# Patient Record
Sex: Female | Born: 1980 | Race: White | Hispanic: No | Smoking: Never smoker
Health system: Southern US, Community
[De-identification: ages and names within clinical notes are randomized; demographics above are authoritative.]

## PROBLEM LIST (undated history)

## (undated) DIAGNOSIS — G43909 Migraine, unspecified, not intractable, without status migrainosus: Secondary | ICD-10-CM

## (undated) DIAGNOSIS — N809 Endometriosis, unspecified: Secondary | ICD-10-CM

## (undated) DIAGNOSIS — I10 Essential (primary) hypertension: Secondary | ICD-10-CM

## (undated) HISTORY — DX: Essential (primary) hypertension: I10

## (undated) HISTORY — DX: Endometriosis, unspecified: N80.9

## (undated) HISTORY — DX: Migraine, unspecified, not intractable, without status migrainosus: G43.909

---
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
## 1999-03-09 ENCOUNTER — Other Ambulatory Visit: Admission: RE | Admit: 1999-03-09 | Discharge: 1999-03-09 | Payer: Self-pay | Admitting: Gynecology

## 2000-01-30 ENCOUNTER — Other Ambulatory Visit: Admission: RE | Admit: 2000-01-30 | Discharge: 2000-01-30 | Payer: Self-pay | Admitting: Internal Medicine

## 2000-05-30 ENCOUNTER — Other Ambulatory Visit: Admission: RE | Admit: 2000-05-30 | Discharge: 2000-05-30 | Payer: Self-pay | Admitting: Gynecology

## 2000-07-10 ENCOUNTER — Other Ambulatory Visit: Admission: RE | Admit: 2000-07-10 | Discharge: 2000-07-10 | Payer: Self-pay | Admitting: Obstetrics and Gynecology

## 2000-07-10 ENCOUNTER — Encounter (INDEPENDENT_AMBULATORY_CARE_PROVIDER_SITE_OTHER): Payer: Self-pay

## 2000-10-12 ENCOUNTER — Other Ambulatory Visit: Admission: RE | Admit: 2000-10-12 | Discharge: 2000-10-12 | Payer: Self-pay | Admitting: Obstetrics and Gynecology

## 2001-02-25 ENCOUNTER — Other Ambulatory Visit: Admission: RE | Admit: 2001-02-25 | Discharge: 2001-02-25 | Payer: Self-pay | Admitting: Obstetrics and Gynecology

## 2002-02-25 ENCOUNTER — Other Ambulatory Visit: Admission: RE | Admit: 2002-02-25 | Discharge: 2002-02-25 | Payer: Self-pay | Admitting: Obstetrics and Gynecology

## 2004-08-30 ENCOUNTER — Other Ambulatory Visit: Admission: RE | Admit: 2004-08-30 | Discharge: 2004-08-30 | Payer: Self-pay | Admitting: Obstetrics & Gynecology

## 2004-08-31 ENCOUNTER — Other Ambulatory Visit: Admission: RE | Admit: 2004-08-31 | Discharge: 2004-08-31 | Payer: Self-pay | Admitting: Obstetrics & Gynecology

## 2005-09-04 ENCOUNTER — Other Ambulatory Visit: Admission: RE | Admit: 2005-09-04 | Discharge: 2005-09-04 | Payer: Self-pay | Admitting: Obstetrics & Gynecology

## 2008-04-30 ENCOUNTER — Inpatient Hospital Stay (HOSPITAL_COMMUNITY): Admission: AD | Admit: 2008-04-30 | Discharge: 2008-05-03 | Payer: Self-pay | Admitting: Obstetrics and Gynecology

## 2011-05-15 LAB — CBC
Hemoglobin: 10.1 — ABNORMAL LOW
MCHC: 34.1
MCHC: 34.6
MCV: 84
MCV: 84.5
Platelets: 271
Platelets: 305
RBC: 3.47 — ABNORMAL LOW
RBC: 4.07
RBC: 4.43
RDW: 12.6
WBC: 13.4 — ABNORMAL HIGH
WBC: 13.4 — ABNORMAL HIGH

## 2011-05-15 LAB — URINALYSIS, ROUTINE W REFLEX MICROSCOPIC
Glucose, UA: NEGATIVE
Leukocytes, UA: NEGATIVE
Protein, ur: NEGATIVE
pH: 6

## 2011-05-15 LAB — COMPREHENSIVE METABOLIC PANEL
ALT: 15
ALT: 16
AST: 14
AST: 25
Albumin: 2.4 — ABNORMAL LOW
Albumin: 2.7 — ABNORMAL LOW
CO2: 22
CO2: 24
Calcium: 9
Calcium: 9.4
Chloride: 105
Creatinine, Ser: 0.6
GFR calc Af Amer: >60
GFR calc Af Amer: >60
GFR calc non Af Amer: >60
GFR calc non Af Amer: >60
Sodium: 134 — ABNORMAL LOW
Sodium: 137
Total Bilirubin: 0.2 — ABNORMAL LOW
Total Protein: 5.4 — ABNORMAL LOW

## 2011-05-15 LAB — URINE MICROSCOPIC-ADD ON

## 2011-05-15 LAB — RPR: RPR Ser Ql: NONREACTIVE

## 2015-09-23 DIAGNOSIS — I10 Essential (primary) hypertension: Secondary | ICD-10-CM | POA: Insufficient documentation

## 2016-05-25 DIAGNOSIS — E782 Mixed hyperlipidemia: Secondary | ICD-10-CM | POA: Insufficient documentation

## 2019-08-15 HISTORY — PX: ANKLE SURGERY: SHX546

## 2020-04-08 ENCOUNTER — Emergency Department (INDEPENDENT_AMBULATORY_CARE_PROVIDER_SITE_OTHER)
Admission: RE | Admit: 2020-04-08 | Discharge: 2020-04-08 | Disposition: A | Discharge status: Home / Self Care | Payer: 59 | Source: Ambulatory Visit

## 2020-04-08 ENCOUNTER — Other Ambulatory Visit: Payer: Self-pay

## 2020-04-08 ENCOUNTER — Emergency Department (INDEPENDENT_AMBULATORY_CARE_PROVIDER_SITE_OTHER): Payer: 59

## 2020-04-08 VITALS — BP 151/90 | HR 73 | Temp 98.8°F | Resp 18

## 2020-04-08 DIAGNOSIS — S93401A Sprain of unspecified ligament of right ankle, initial encounter: Secondary | ICD-10-CM

## 2020-04-08 DIAGNOSIS — M25572 Pain in left ankle and joints of left foot: Secondary | ICD-10-CM

## 2020-04-08 DIAGNOSIS — M25471 Effusion, right ankle: Secondary | ICD-10-CM

## 2020-04-08 DIAGNOSIS — M25472 Effusion, left ankle: Secondary | ICD-10-CM

## 2020-04-08 DIAGNOSIS — M25571 Pain in right ankle and joints of right foot: Secondary | ICD-10-CM | POA: Diagnosis not present

## 2020-04-08 DIAGNOSIS — W1839XA Other fall on same level, initial encounter: Secondary | ICD-10-CM | POA: Diagnosis not present

## 2020-04-08 DIAGNOSIS — M25562 Pain in left knee: Secondary | ICD-10-CM

## 2020-04-08 DIAGNOSIS — W010XXA Fall on same level from slipping, tripping and stumbling without subsequent striking against object, initial encounter: Secondary | ICD-10-CM

## 2020-04-08 DIAGNOSIS — M958 Other specified acquired deformities of musculoskeletal system: Secondary | ICD-10-CM

## 2020-04-08 NOTE — ED Triage Notes (Signed)
Pt c/o bilateral ankle pain, more so the RT and LT knee pain after falling carrying boxes while moving. Pain 5/10 Ibuprofen prn.

## 2020-04-08 NOTE — Discharge Instructions (Signed)
  You may take 500mg  acetaminophen every 4-6 hours or in combination with ibuprofen 400-600mg  every 6-8 hours as needed for pain and inflammation.  Call to schedule follow up with Sports Medicine in 1-2 weeks for recheck of symptoms and further evaluation of chronic right ankle pain and weakness.

## 2020-04-08 NOTE — ED Provider Notes (Signed)
[facility]    CSN: [id number] Arrival date & time: 04/08/20  1251      History   Chief Complaint Chief Complaint  Patient presents with  . Appointment    1pm  . Ankle Pain    Bilateral    HPI Courtney Kennedy is a 39 y.o. female.   HPI  Courtney Kennedy is a 39 y.o. female presenting to UC with c/o bilateral ankle pain and swelling, worse in Right ankle, and Left knee pain and swelling to anterior aspect after rolling her Right ankle and falling yesterday while carrying a box.  Pain is aching, 5/10. No hx of fracture in ankles in the past but reports hx of chronic ankle weakness in Right ankle. Denies hitting her head during the fall.    History reviewed. No pertinent past medical history.  There are no problems to display for this patient.   History reviewed. No pertinent surgical history.  OB History   No obstetric history on file.      Home Medications    Prior to Admission medications   Medication Sig Start Date End Date Taking? Authorizing Provider  cetirizine (ZYRTEC) 10 MG tablet Take 10 mg by mouth daily.   Yes [provider]  fexofenadine-pseudoephedrine (ALLEGRA-D 24) 180-240 MG 24 hr tablet Take 1 tablet by mouth daily. 11/03/19 11/02/20 Yes [provider]  lisinopril (ZESTRIL) 10 MG tablet Take by mouth. 11/24/19  Yes [provider]  rizatriptan (MAXALT) 10 MG tablet Take by mouth. 08/22/19  Yes [provider]    Family History History reviewed. No pertinent family history.  Social History Social History   Tobacco Use  . Smoking status: Never Smoker  . Smokeless tobacco: Never Used  Vaping Use  . Vaping Use: Never used  Substance Use Topics  . Alcohol use: Not Currently  . Drug use: Not on file     Allergies   Patient has no known allergies.   Review of Systems Review of Systems  Musculoskeletal: Positive for arthralgias and joint swelling.  Skin: Negative for color change and wound.      Physical Exam Triage Vital Signs ED Triage Vitals  Enc Vitals Group     BP 04/08/20 1318 (!) 151/90     Pulse Rate 04/08/20 1318 73     Resp 04/08/20 1318 18     Temp 04/08/20 1318 98.8 F (37.1 C)     Temp Source 04/08/20 1318 Oral     SpO2 04/08/20 1318 99 %     Weight --      Height --      Head Circumference --      Peak Flow --      Pain Score 04/08/20 1319 5     Pain Loc --      Pain Edu? --      Excl. in GC? --    No data found.  Updated Vital Signs BP (!) 151/90 (BP Location: Right Arm)   Pulse 73   Temp 98.8 F (37.1 C) (Oral)   Resp 18   SpO2 99%   Visual Acuity Right Eye Distance:   Left Eye Distance:   Bilateral Distance:    Right Eye Near:   Left Eye Near:    Bilateral Near:     Physical Exam Vitals and nursing note reviewed.  Constitutional:      Appearance: Normal appearance. She is well-developed.  HENT:     Head: Normocephalic and atraumatic.  Cardiovascular:     Rate and Rhythm: Normal rate.  Pulmonary:     Effort: Pulmonary effort is normal.  Musculoskeletal:        General: Swelling and tenderness present. Normal range of motion.     Cervical back: Normal range of motion.     Comments: Right ankle: moderate edema lateral aspect, tender. Full ROM, increased pain with dorsi-and plantar- flexion Left ankle: mild edema to lateral aspect with tenderness. Full ROM Left knee: mild edema, tenderness over patella. Full ROM Calves are soft, non-tender   Skin:    General: Skin is warm and dry.     Findings: No bruising or erythema.  Neurological:     Mental Status: She is alert and oriented to person, place, and time.  Psychiatric:        Behavior: Behavior normal.      UC Treatments / Results  Labs (all labs ordered are listed, but only abnormal results are displayed) Labs Reviewed - No data to display  EKG   Radiology DG Ankle Complete Left  Result Date: 04/08/2020 CLINICAL DATA:  Left ankle pain after fall yesterday  EXAM: LEFT ANKLE COMPLETE - 3+ VIEW COMPARISON:  None. FINDINGS: There is no evidence of fracture, dislocation, or joint effusion. There is no evidence of arthropathy or other focal bone abnormality. Soft tissues are unremarkable. IMPRESSION: Negative. Electronically Signed   By: Lupita Raider M.D.   On: 04/08/2020 14:16   DG Ankle Complete Right  Result Date: 04/08/2020 CLINICAL DATA:  Lateral right ankle pain after fall EXAM: RIGHT ANKLE - COMPLETE 3+ VIEW COMPARISON:  None. FINDINGS: There is no evidence of fracture, dislocation, or joint effusion. There is a 4 mm rounded lucency at the medial talar shoulder. Joint spaces are maintained without evidence of significant arthropathy. Diffuse soft tissue swelling about the right ankle. IMPRESSION: 1. Diffuse soft tissue swelling about the right ankle without acute fracture or dislocation. 2. 4 mm rounded lucency at the medial talar shoulder suggestive of an osteochondral defect. MRI could be performed for further characterization on a nonemergent outpatient basis. Electronically Signed   By: Duanne Guess D.O.   On: 04/08/2020 14:14   DG Knee Complete 4 Views Left  Result Date: 04/08/2020 CLINICAL DATA:  Left knee pain after fall yesterday. EXAM: LEFT KNEE - COMPLETE 4+ VIEW COMPARISON:  None. FINDINGS: No evidence of fracture, dislocation, or joint effusion. No evidence of arthropathy or other focal bone abnormality. Soft tissues are unremarkable. IMPRESSION: Negative. Electronically Signed   By: Lupita Raider M.D.   On: 04/08/2020 14:15    Procedures Procedures (including critical care time)  Medications Ordered in UC Medications - No data to display  Initial Impression / Assessment and Plan / UC Course  I have reviewed the triage vital signs and the nursing notes.  Pertinent labs & imaging results that were available during my care of the patient were reviewed by me and considered in my medical decision making (see chart for  details).     Reviewed imaging with pt Pt placed in stirrup splint on Right ankle for comfort and support F/u with Sports Medicine  AVS given  Final Clinical Impressions(s) / UC Diagnoses   Final diagnoses:  Pain and swelling of right ankle  Pain and swelling of left ankle  Pain and swelling of left knee  Osteochondral defect of ankle  Sprain of right ankle, unspecified ligament, initial encounter  Fall from slip, trip, or stumble, initial encounter  Discharge Instructions      You may take 500mg  acetaminophen every 4-6 hours or in combination with ibuprofen 400-600mg  every 6-8 hours as needed for pain and inflammation.  Call to schedule follow up with Sports Medicine in 1-2 weeks for recheck of symptoms and further evaluation of chronic right ankle pain and weakness.     ED Prescriptions    None     PDMP not reviewed this encounter.   , Lurene Shadow 04/08/20 1524

## 2020-04-26 ENCOUNTER — Ambulatory Visit (INDEPENDENT_AMBULATORY_CARE_PROVIDER_SITE_OTHER): Payer: 59 | Admitting: Sports Medicine

## 2020-04-26 DIAGNOSIS — M958 Other specified acquired deformities of musculoskeletal system: Secondary | ICD-10-CM | POA: Diagnosis not present

## 2020-04-26 MED ORDER — MELOXICAM 15 MG PO TABS: ORAL_TABLET | ORAL | 3 refills | Status: DC

## 2020-04-26 NOTE — Progress Notes (Signed)
    Procedures performed today:    None.  Independent interpretation of notes and tests performed by another provider:   None.  Brief History, Exam, Impression, and Recommendations:    Courtney Kennedy is a pleasant 39yo female who presents today for follow up on right ankle pain. A couple weeks ago she fell while moving boxes. She was seen in UC a couple of weeks ago who did Xrays that demonstrated an osteochondral defect of the right medial talus. She has point tenderness over this location. She also has pain on the posterior aspect of the lateral malleolus. We are going to immobilize her ankle in a CAM boot for 4 weeks and give her some meloxicam. We will have her back to reevaluate in 4 weeks. If the pain is no better we will get an MRI.   Aurelio Jew, MS3   ___________________________________________ Ihor Austin. Benjamin Stain, M.D., ABFM., CAQSM. Primary Care and Sports Medicine [facility]  Adjunct Instructor of Family Medicine  [employer]

## 2020-04-26 NOTE — Patient Instructions (Signed)
Osteochondral Fracture  An osteochondral fracture is a break or tear (rupture) in the protective tissue that cushions bones in the joints (articular cartilage). Osteochondral fractures most commonly happen in the knees or ankles, but they can also happen in other parts of the body. What are the causes? This condition is caused by a forceful hit to your joint. It can be caused by one event or from repetitive damage to your joint over time. What increases the risk? You are more likely to develop this condition if you are:  A younger person. Children and adolescents are at greater risk of this condition.  An athlete. What are the signs or symptoms? Symptoms of this condition include:  Swelling.  Pain.  A crackling sound (crepitation) when you move the joint.  Difficulty moving the joint, or feeling like the joint catches or locks when you try to move it.  Instability of the joint. How is this diagnosed? This condition is diagnosed based on:  Your complete medical history.  A physical exam.  Imaging tests. These may include X-rays, a CT scan, or an MRI. How is this treated? This condition may be treated by:  Icing the injured area and taking medicines. These help with pain and inflammation.  Using crutches. These help with walking. They may be needed if the injured joint is in one of the legs.  Wearing a cast or splint. This keeps the cartilage and bones in place so they can heal.  Having surgery. This may be done if other treatments do not work. During the surgery, any bones that have broken off will either be put into place or be removed if they cannot be reattached.  Doing joint and muscle exercises. These help to strengthen and stretch the affected joints and muscles. They may be done at home or with a physical therapist. If treated properly, symptoms go away in most cases. Follow these instructions at home: Medicines  Take over-the-counter and prescription medicines as  told by your health care provider.  Ask your health care provider if the medicine prescribed to you: ? Requires you to avoid driving or using heavy machinery. ? Can cause constipation. You may need to take actions to prevent or treat constipation, such as:  Drink enough fluid to keep your urine pale yellow.  Take over-the-counter or prescription medicines.  Eat foods that are high in fiber, such as beans, whole grains, and fresh fruits and vegetables.  Limit foods that are high in fat and processed sugars, such as fried or sweet foods. If you have a cast:  Do not stick anything inside the cast to scratch your skin. Doing that increases your risk of infection.  You may put lotion on dry skin around the edges of the cast. Do not put lotion on the skin underneath the cast.  Check the skin around the cast every day. Tell your health care provider about any concerns.  Keep the cast clean and dry. If you have a splint:  Wear the splint as told by your health care provider. Remove it only as told by your health care provider.  Loosen the splint if your fingers or toes tingle, become numb, or turn cold and blue.  Keep the splint clean and dry. Bathing  Do not take baths, swim, or use a hot tub until your health care provider approves. Ask your health care provider if you may take showers. You may only be allowed to take sponge baths.  If your cast or splint  is not waterproof: ? Do not let it get wet. ? Cover it with a watertight covering when you take a bath or shower. Managing pain, stiffness, and swelling   If directed, put ice on the injured area. ? If you have a removable splint, remove it as told by your health care provider. ? Put ice in a plastic bag. ? Place a towel between your skin and the bag or between your cast and the bag. ? Leave the ice on for 20 minutes, 2-3 times a day.  Move your fingers or toes often to reduce stiffness and swelling.  Raise (elevate) the  injured area above the level of your heart while you are sitting or lying down. Activity  Do not lift anything that is heavier than 10 lb (4.5 kg), or the limit that you are told, until your health care provider says that it is safe.  Perform exercises daily as told by your health care provider or physical therapist.  Return to your normal activities as told by your health care provider. Ask your health care provider what activities are safe for you. General instructions  Do not put pressure on any part of the cast or splint until it is fully hardened. This may take several hours.  Ask your health care provider when it is safe to drive if you have a cast or splint on your arm or leg.  Do not use any products that contain nicotine or tobacco, such as cigarettes, e-cigarettes, and chewing tobacco. These can delay healing. If you need help quitting, ask your health care provider.  Keep all follow-up visits as told by your health care provider. This is important. Contact a health care provider if:  You have symptoms that get worse or do not improve after 2 weeks of treatment. Get help right away if:  You have severe pain. Summary  An osteochondral fracture is a break or tear in the protective tissue that cushions bones in the joints.  This condition is caused by a forceful hit to your joint. It can be caused by one event or from repetitive damage to your joint over time.  These fractures most commonly happen in the knees or ankles, but they can also happen in other parts of the body.  This injury is treated with rest, ice, medicines, and surgery if needed.  Contact a health care provider if your symptoms get worse or do not improve after 2 weeks of treatment. This information is not intended to replace advice given to you by your health care provider. Make sure you discuss any questions you have with your health care provider. Document Revised: 11/21/2018 Document Reviewed:  07/08/2018 Elsevier Patient Education  2020 [employer].

## 2020-04-26 NOTE — Assessment & Plan Note (Signed)
This is a very pleasant 39 year old female, she is currently moving, unfortunately she had a fall, injured her knee and her ankles, her left ankle and knee are getting better, unfortunately she still has significant pain both at the medial talar dome as well as behind the lateral malleolus. She was seen in urgent care where x-rays showed what appeared to be an osteochondral defect of the medial talar dome. The fall was 2 weeks ago so I do think she needs prolonged immobilization for at least another month with minimal weightbearing. We will place her in a cam boot, add meloxicam for pain, return to see me in a month, MRI if no better.

## 2020-05-01 MED ORDER — TRAMADOL HCL 50 MG PO TABS: 50.0000 mg | ORAL_TABLET | Freq: Three times a day (TID) | ORAL | 0 refills | Status: DC | PRN

## 2020-05-24 ENCOUNTER — Ambulatory Visit (INDEPENDENT_AMBULATORY_CARE_PROVIDER_SITE_OTHER): Payer: 59 | Admitting: Sports Medicine

## 2020-05-24 ENCOUNTER — Other Ambulatory Visit: Payer: Self-pay

## 2020-05-24 ENCOUNTER — Ambulatory Visit (INDEPENDENT_AMBULATORY_CARE_PROVIDER_SITE_OTHER): Payer: 59

## 2020-05-24 DIAGNOSIS — S93491A Sprain of other ligament of right ankle, initial encounter: Secondary | ICD-10-CM | POA: Diagnosis not present

## 2020-05-24 DIAGNOSIS — S93412A Sprain of calcaneofibular ligament of left ankle, initial encounter: Secondary | ICD-10-CM

## 2020-05-24 DIAGNOSIS — M958 Other specified acquired deformities of musculoskeletal system: Secondary | ICD-10-CM | POA: Diagnosis not present

## 2020-05-24 NOTE — Assessment & Plan Note (Signed)
This is a very pleasant 39 year old female, she was moving, had a fall, injured her knee and her ankle. Unfortunately 1 month is passed and she continues to have severe pain in her ankle in spite of boot immobilization. X-rays at the time did show what appeared to be a large medial talar dome OCD. Due to failure of conservative treatment we are going to proceed with MRI as we are planning a surgical procedure such as microfracture or cartilage plug. I am also going to going to set her up with a referral to Dr. Susa Simmonds.

## 2020-05-24 NOTE — Progress Notes (Signed)
    Procedures performed today:    None.  Independent interpretation of notes and tests performed by another provider:   None.  Brief History, Exam, Impression, and Recommendations:    Osteochondral defect of right medial talus This is a very pleasant 39 year old female, she was moving, had a fall, injured her knee and her ankle. Unfortunately 1 month is passed and she continues to have severe pain in her ankle in spite of boot immobilization. X-rays at the time did show what appeared to be a large medial talar dome OCD. Due to failure of conservative treatment we are going to proceed with MRI as we are planning a surgical procedure such as microfracture or cartilage plug. I am also going to going to set her up with a referral to Dr. Susa Simmonds.    ___________________________________________ Ihor Austin. Benjamin Stain, M.D., ABFM., CAQSM. Primary Care and Sports Medicine [place] [facility]  Adjunct Instructor of Family Medicine  [employer]

## 2020-06-11 ENCOUNTER — Other Ambulatory Visit: Payer: Self-pay

## 2020-06-11 ENCOUNTER — Ambulatory Visit (INDEPENDENT_AMBULATORY_CARE_PROVIDER_SITE_OTHER): Payer: 59 | Admitting: Sports Medicine

## 2020-06-11 ENCOUNTER — Ambulatory Visit (INDEPENDENT_AMBULATORY_CARE_PROVIDER_SITE_OTHER): Payer: 59

## 2020-06-11 DIAGNOSIS — M958 Other specified acquired deformities of musculoskeletal system: Secondary | ICD-10-CM

## 2020-06-11 DIAGNOSIS — M2242 Chondromalacia patellae, left knee: Secondary | ICD-10-CM | POA: Diagnosis not present

## 2020-06-11 NOTE — Progress Notes (Signed)
° ° °  Procedures performed today:    Procedure: Real-time Ultrasound Guided injection of the left knee Device: Samsung HS60  Verbal informed consent obtained.  Time-out conducted.  Noted no overlying erythema, induration, or other signs of local infection.  Skin prepped in a sterile fashion.  Local anesthesia: Topical Ethyl chloride.  With sterile technique and under real time ultrasound guidance:  Noted no effusion, 1 cc Kenalog 40, 2 cc lidocaine, 2 cc bupivacaine injected easily Completed without difficulty  Pain immediately resolved suggesting accurate placement of the medication.  Advised to call if fevers/chills, erythema, induration, drainage, or persistent bleeding.  Images permanently stored and available for review in PACS.  Impression: Technically successful ultrasound guided injection.  Independent interpretation of notes and tests performed by another provider:   None.  Brief History, Exam, Impression, and Recommendations:    Chondromalacia of patellofemoral joint, left This is a pleasant 39 year old female, she is coming in with left-sided knee pain, localized mostly anteriorly, and at rest. She is tender around the patellar facets, exam is otherwise unremarkable. She has been taking some over-the-counter analgesics without much improvement. Today we injected her knee, adding some formal physical therapy. Return to see me in 4 weeks, MRI if no better.  Osteochondral defect of right medial talus Dr. Susa Simmonds has consulted on the large medial talar dome OCD, the current plan is immobilization and nonweightbearing.     ___________________________________________ Ihor Austin. Benjamin Stain, M.D., ABFM., CAQSM. Primary Care and Sports Medicine [place] [facility]  Adjunct Instructor of Family Medicine  [employer]

## 2020-06-11 NOTE — Assessment & Plan Note (Signed)
Dr. Susa Simmonds has consulted on the large medial talar dome OCD, the current plan is immobilization and nonweightbearing.

## 2020-06-11 NOTE — Assessment & Plan Note (Signed)
This is a pleasant 39 year old female, she is coming in with left-sided knee pain, localized mostly anteriorly, and at rest. She is tender around the patellar facets, exam is otherwise unremarkable. She has been taking some over-the-counter analgesics without much improvement. Today we injected her knee, adding some formal physical therapy. Return to see me in 4 weeks, MRI if no better.

## 2020-06-16 ENCOUNTER — Encounter: Payer: Self-pay | Admitting: Physical Therapy

## 2020-06-16 ENCOUNTER — Ambulatory Visit (INDEPENDENT_AMBULATORY_CARE_PROVIDER_SITE_OTHER): Payer: 59 | Admitting: Physical Therapy

## 2020-06-16 ENCOUNTER — Other Ambulatory Visit: Payer: Self-pay

## 2020-06-16 DIAGNOSIS — M6281 Muscle weakness (generalized): Secondary | ICD-10-CM | POA: Diagnosis not present

## 2020-06-16 DIAGNOSIS — M25562 Pain in left knee: Secondary | ICD-10-CM

## 2020-06-16 NOTE — Therapy (Addendum)
[place]  [address] [place], [place], [address] Phone: [phone]   Fax:  [phone]  Physical Therapy Evaluation and Discharge Summary  Patient Details  Name: Courtney Kennedy MRN: [id number] Date of Birth: 1981-03-03 Referring Provider (PT): Aundria Mems   Encounter Date: 06/16/2020   PT End of Session - 06/16/20 0849    Visit Number 1    Date for PT Re-Evaluation 07/28/20    Authorization Type [employer]    PT Start Time 0849    PT Stop Time 0930    PT Time Calculation (min) 41 min    Activity Tolerance Patient tolerated treatment well    Behavior During Therapy [facility] for tasks assessed/performed           History reviewed. No pertinent past medical history.  History reviewed. No pertinent surgical history.  There were no vitals filed for this visit.    Subjective Assessment - 06/16/20 0852    Subjective Patient fell in August and sprained both ankles and fell on right knee. The right ankle had an old injury which she thinks caused the fall. She is now in a CAM boot until the end of the month. Xrays neg. About 1.5 weeks ago right knee was swollen, but not much pain. She had just started using her scooter 3 days prior. Last week she had injection. Normal WB no pain. It was aggravated going up hills this past weekend.    Pertinent History right ankle in CAM boot; NWB; previous osteochondral lesion of talar dome, ant talofib lig sprain and calcanealfibular lig tear, HTN, migraines    Diagnostic tests xrays negative (left knee)    Patient Stated Goals fix the problem    Currently in Pain? Yes    Pain Score 5     Pain Location Knee    Pain Orientation Left   under knee cap   Pain Descriptors / Indicators Sharp    Pain Type Acute pain    Pain Onset 1 to 4 weeks ago    Pain Frequency Intermittent    Aggravating Factors  unknown    Pain Relieving Factors ibuprofen prior to injection              [facility]  PT Assessment - 06/16/20 0001      Assessment   Medical Diagnosis left PF chondromalacia    Referring Provider (PT) Aundria Mems    Onset Date/Surgical Date 06/05/20    Hand Dominance Right    Next MD Visit 07/12/20    Prior Therapy no      Precautions   Precaution Comments In CAM Boot for right ankle       Restrictions   Weight Bearing Restrictions Yes    RLE Weight Bearing Non weight bearing      Balance Screen   Has the patient fallen in the past 6 months Yes    How many times? 1    Has the patient had a decrease in activity level because of a fear of falling?  No    Is the patient reluctant to leave their home because of a fear of falling?  No      Home Environment   Living Environment Private residence    Living Arrangements Spouse/significant other;Parent    Additional Comments sleeping on couch at inlaws as bedroom in basement;       Prior Function   Level of Independence Independent    Vocation Full time employment;Works at home  Vocation Requirements desk work      Observation/Other Assessments   Focus on Therapeutic Outcomes (FOTO)  43% limited (23% goal)      ROM / Strength   AROM / PROM / Strength AROM;Strength      AROM   Overall AROM Comments Lt knee 0-118 deg      Strength   Overall Strength Comments Bil hip flex 4+/5, ABD Lt 4-/5, Rt 4+/5, bil ext 4+/5; Bil knees 5/5      Flexibility   Soft Tissue Assessment /Muscle Length yes    Hamstrings mild right    Quadriceps WNL    ITB WNL    Piriformis mild right      Palpation   Patella mobility pain with sup/inf glide; mild with med lat; a little more mobile than right     Palpation comment tender in left medial joint line, proximal gastroc med>lat; popliteus (very tender)       Special Tests   Other special tests neg Apley compression                      Objective measurements completed on examination: See above findings.       [facility] Adult PT Treatment/Exercise -  06/16/20 0001      Modalities   Modalities Iontophoresis      Iontophoresis   Type of Iontophoresis Dexamethasone    Location left medial knee    Dose 59m/ml    Time 4-6 hour patch                  PT Education - 06/16/20 1814    Education Details HEP - quad sets, SLR, calf stretch; ionto ed    Person(s) Educated Patient    Methods Explanation;Demonstration;Handout    Comprehension Verbalized understanding;Returned demonstration               PT Long Term Goals - 06/16/20 1830      PT LONG TERM GOAL #1   Title Able to amb and perform ADLS decreased pain by 90% or more in the left knee    Time 6    Period Weeks    Status New    Target Date 07/28/20      PT LONG TERM GOAL #2   Title improved bil hip strength to 5/5 to help prevent further injury.    Time 6    Period Weeks    Status New      PT LONG TERM GOAL #3   Title improved FOTO LIMITATIONS to <=23%    Time 6    Period Weeks    Status New      PT LONG TERM GOAL #4   Title ind in advanced HEP to maintain strength and flexibilty    Time 6    Period Weeks    Status New                  Plan - 06/16/20 1819    Clinical Impression Statement Patient presents to PT with c/o left intermittent knee pain since 06/05/20, three days after starting to use scooter for NWB right ankle. She had an injection which helped. She has tendernes and tightness in the left gastroc and popliteus and at medial joint line and medial patella. Her patella is a little hypermobile vs. right. Her knee is strong, but she has bil hip weakness. She will benefit from PT to decrease her pain and return her to her PLOF.  Personal Factors and Comorbidities Comorbidity 3+    Comorbidities right ankle in CAM boot; NWB; previous osteochondral lesion of talar dome, ant talofib lig sprain and calcanealfibular lig tear, HTN, migraines    Examination-Activity Limitations Locomotion Level    Stability/Clinical Decision Making  Stable/Uncomplicated    Clinical Decision Making Low    Rehab Potential Excellent    PT Frequency 2x / week    PT Duration 6 weeks    PT Treatment/Interventions ADLs/Self Care Home Management;Aquatic Therapy;Cryotherapy;Electrical Stimulation;Iontophoresis 6m/ml Dexamethasone;Therapeutic exercise;Patient/family education;Manual techniques;Dry needling;Taping;Vasopneumatic Device    PT Next Visit Plan bil hip strength, left knee strength, assess ionto and continue prn; DN/manual to left gastroc    PT Home Exercise Plan quad set, slr, gastroc stretch (has code but Medbridge down)    Consulted and Agree with Plan of Care Patient           Patient will benefit from skilled therapeutic intervention in order to improve the following deficits and impairments:  Difficulty walking, Increased muscle spasms, Pain, Hypermobility, Impaired flexibility, Increased edema, Decreased strength, Postural dysfunction  Visit Diagnosis: Acute pain of left knee - Plan: PT plan of care cert/re-cert  Muscle weakness (generalized) - Plan: PT plan of care cert/re-cert     Problem List Patient Active Problem List   Diagnosis Date Noted  . Chondromalacia of patellofemoral joint, left 06/11/2020  . Osteochondral defect of right medial talus 04/26/2020   JMadelyn FlavorsPT 06/16/2020, 6:36 PM  [facility] [address] [address]Phone: [phone]  Fax:  [phone] Name: Courtney WALDOMRN: [id number] of Birth: 112-21-1982 PHYSICAL THERAPY DISCHARGE SUMMARY  Visits from Start of Care: 1  Current functional level related to goals / functional outcomes: unknown   Remaining deficits: unknown   Education / Equipment: HEP  Plan: Patient agrees to discharge.  Patient goals were not met. Patient is being discharged due to not returning since the last visit.  ?????     JMadelyn Kennedy PT 09/16/20 2:03 PM  [facility] [place] [address] [phone](office) [phone](fax)

## 2020-06-16 NOTE — Patient Instructions (Signed)
HEP provided; Medbridge down so couldn't copy over. Quad sets [employer] Gastroc stretch Ionto education/precautions

## 2020-06-23 ENCOUNTER — Encounter: Payer: 59 | Admitting: Physical Therapy

## 2020-06-25 ENCOUNTER — Encounter: Payer: 59 | Admitting: Physical Therapy

## 2020-06-28 ENCOUNTER — Encounter: Payer: 59 | Admitting: Physical Therapy

## 2020-07-01 ENCOUNTER — Encounter: Payer: 59 | Admitting: Physical Therapy

## 2020-07-12 ENCOUNTER — Ambulatory Visit: Payer: 59 | Admitting: Sports Medicine

## 2020-07-13 ENCOUNTER — Other Ambulatory Visit: Payer: Self-pay | Admitting: Orthopaedic Surgery

## 2020-07-13 DIAGNOSIS — M25571 Pain in right ankle and joints of right foot: Secondary | ICD-10-CM

## 2020-07-16 ENCOUNTER — Ambulatory Visit
Admission: RE | Admit: 2020-07-16 | Discharge: 2020-07-16 | Disposition: A | Discharge status: Home / Self Care | Payer: 59 | Source: Ambulatory Visit | Attending: Orthopaedic Surgery | Admitting: Orthopaedic Surgery

## 2020-07-16 DIAGNOSIS — M25571 Pain in right ankle and joints of right foot: Secondary | ICD-10-CM

## 2022-04-18 IMAGING — MR MR ANKLE*R* W/O CM
5 series · 40 of 40 positions shown · non-contrast
Comparison: Plain films right ankle 04/08/2020.

CLINICAL DATA: Right ankle pain for 10 months. The patient suffered
a fall 2 months ago.

EXAM:
MRI OF THE RIGHT ANKLE WITHOUT CONTRAST
TECHNIQUE: Multiplanar, multisequence MR imaging of the ankle was performed. No
intravenous contrast was administered.

[Series 6: T2 fat-sat · axial · 3.0mm · 0.62mm/px · z∈[-123,+0]mm · 9 of 32 slices shown]
[im 1/32]
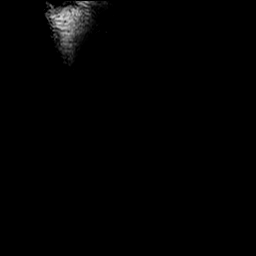
[im 4/32]
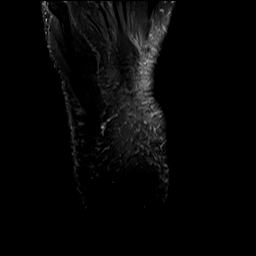
[im 8/32]
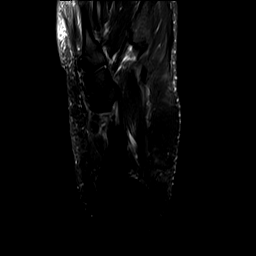
[im 12/32]
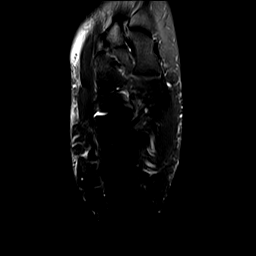
[im 16/32]
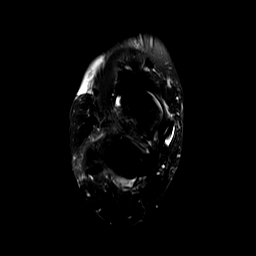
[im 20/32]
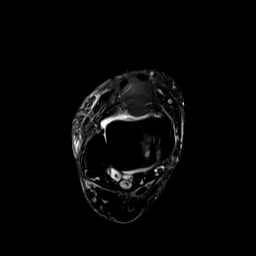
[im 24/32]
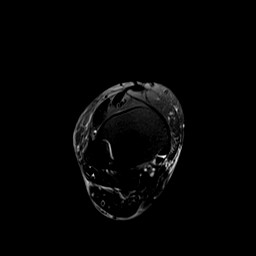
[im 28/32]
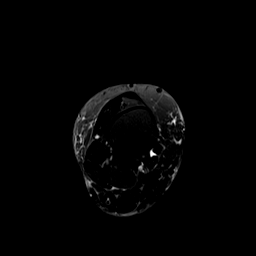
[im 32/32]
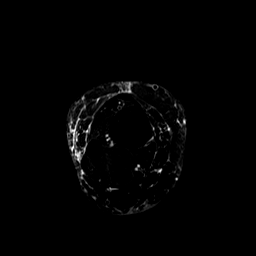

[Series 7: PD fat-sat · axial · 3.0mm · 0.62mm/px · z∈[-123,+0]mm · 8 of 32 slices shown]
[im 1/32]
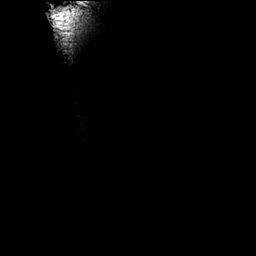
[im 5/32]
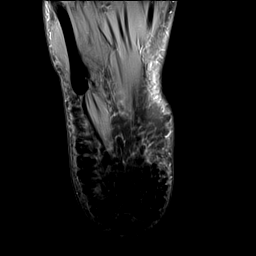
[im 9/32]
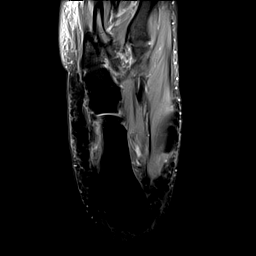
[im 14/32]
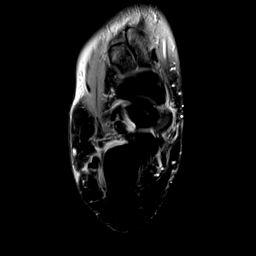
[im 18/32]
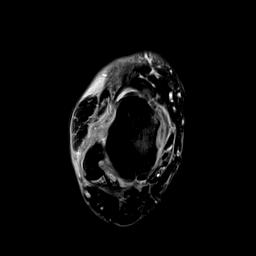
[im 23/32]
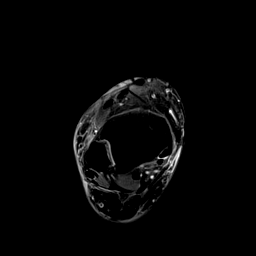
[im 27/32]
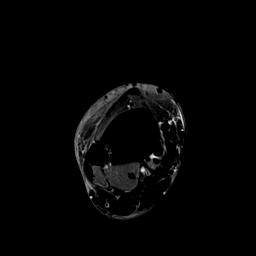
[im 32/32]
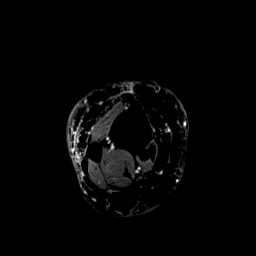

[Series 8: T1 · sagittal · 3.0mm · 0.62mm/px · 7 of 27 slices shown]
[im 1/27]
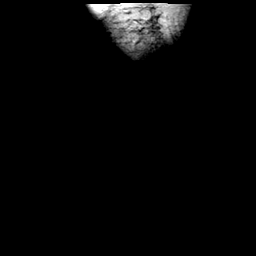
[im 5/27]
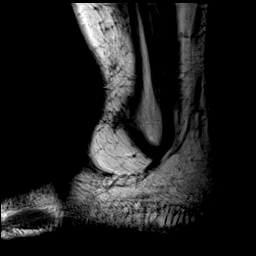
[im 9/27]
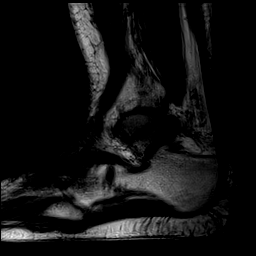
[im 14/27]
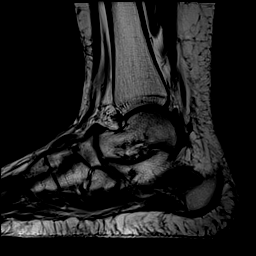
[im 18/27]
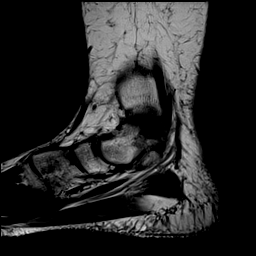
[im 22/27]
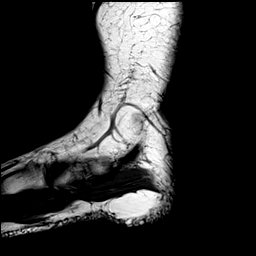
[im 27/27]
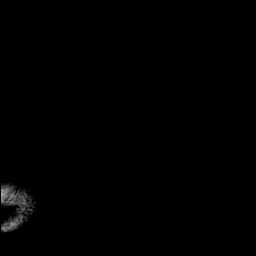

[Series 9: STIR · sagittal · 3.0mm · 0.62mm/px · 7 of 27 slices shown (1 of 2)]
[im 1/27]
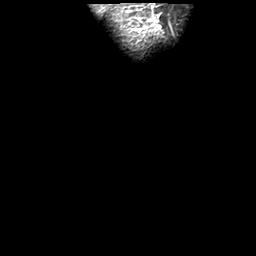
[im 5/27]
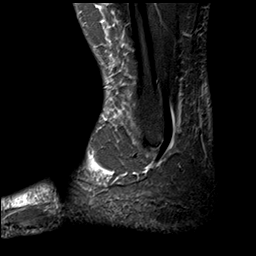
[im 9/27]
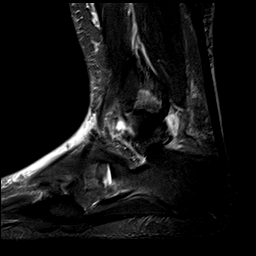
[im 14/27]
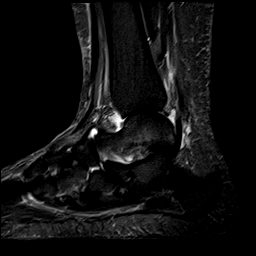
[im 18/27]
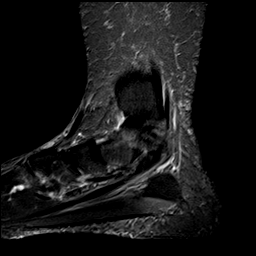
[im 22/27]
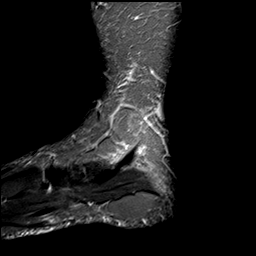
[im 27/27]
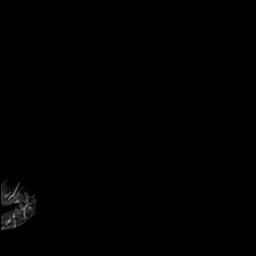

[Series 10: STIR · coronal · 3.0mm · 0.62mm/px · 9 of 36 slices shown (2 of 2)]
[im 1/36]
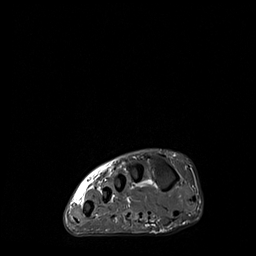
[im 5/36]
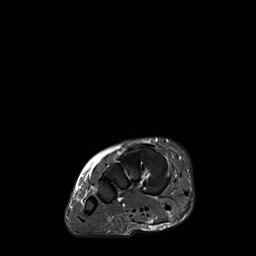
[im 9/36]
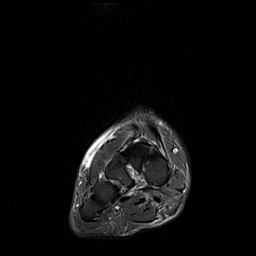
[im 14/36]
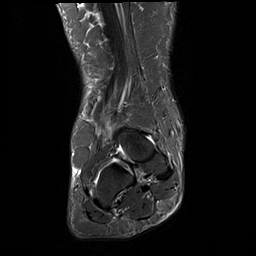
[im 18/36]
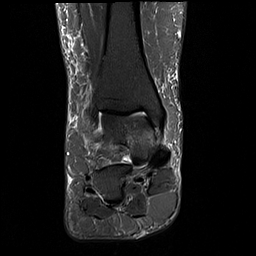
[im 22/36]
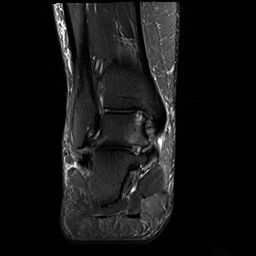
[im 27/36]
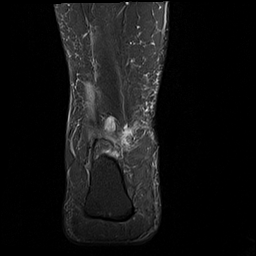
[im 31/36]
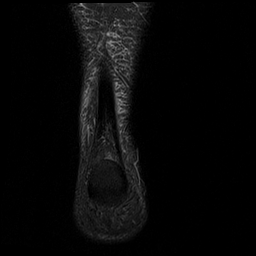
[im 36/36]
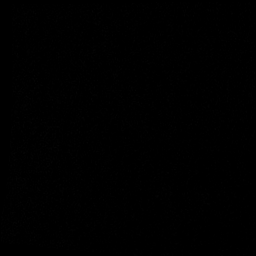

[40 of 40 positions shown; findings below may reference images not displayed]

FINDINGS: TENDONS

Peroneal: Intact.

Posteromedial: Intact.

Anterior: Intact.

Achilles: Intact.

Plantar Fascia: Intact.  No evidence of fasciitis.

LIGAMENTS

Lateral: The anterior talofibular ligament is markedly thickened
with intermediate increased T2 signal consistent with high-grade
sprain. The ligament is likely deficient. The calcaneofibular
ligament is torn.

Medial: Intact.

CARTILAGE

Ankle Joint: There is an osteochondral lesion of the medial talar
dome measuring 1.6 cm AP by 0.9 cm transverse. There is an overlying
cartilage defect. Extensive subchondral cyst formation and edema are
seen deep to the lesion. No joint effusion.

Subtalar Joints/Sinus Tarsi: Normal.

Bones: No fracture or focal lesion.  No evidence of stress change.

Other: None.
IMPRESSION: Large osteochondral lesion of the medial talar dome with underlying
subchondral cyst formation and edema worrisome for fragment
instability.

Markedly abnormal appearance of the anterior talofibular ligament
consistent with sprain. The ligament is likely deficient. The
calcaneofibular ligament is completely torn

## 2022-10-31 ENCOUNTER — Ambulatory Visit: Payer: 59 | Admitting: Family Medicine

## 2022-11-01 ENCOUNTER — Encounter: Payer: Self-pay | Admitting: Family Medicine

## 2022-11-01 ENCOUNTER — Ambulatory Visit: Payer: 59 | Admitting: Family Medicine

## 2022-11-01 VITALS — BP 170/107 | HR 72 | Temp 98.8°F | Ht 65.0 in | Wt 285.3 lb

## 2022-11-01 DIAGNOSIS — Z0000 Encounter for general adult medical examination without abnormal findings: Secondary | ICD-10-CM | POA: Diagnosis not present

## 2022-11-01 DIAGNOSIS — G43119 Migraine with aura, intractable, without status migrainosus: Secondary | ICD-10-CM

## 2022-11-01 DIAGNOSIS — Z7689 Persons encountering health services in other specified circumstances: Secondary | ICD-10-CM

## 2022-11-01 DIAGNOSIS — I10 Essential (primary) hypertension: Secondary | ICD-10-CM

## 2022-11-01 DIAGNOSIS — Z6841 Body Mass Index (BMI) 40.0 and over, adult: Secondary | ICD-10-CM | POA: Insufficient documentation

## 2022-11-01 MED ORDER — RIZATRIPTAN BENZOATE 10 MG PO TABS: 10.0000 mg | ORAL_TABLET | ORAL | 0 refills | Status: DC | PRN

## 2022-11-01 MED ORDER — HYDROCHLOROTHIAZIDE 12.5 MG PO TABS: 12.5000 mg | ORAL_TABLET | Freq: Every day | ORAL | 0 refills | Status: DC

## 2022-11-01 MED ORDER — LISINOPRIL 5 MG PO TABS: 5.0000 mg | ORAL_TABLET | Freq: Every day | ORAL | 3 refills | Status: DC

## 2022-11-01 NOTE — Assessment & Plan Note (Addendum)
Taking lisinopril 5 mg with hydrochlorothiazide 12.5 mg daily as prescribed.  Reports taking blood pressure medicine just prior to this visit.  Denies chest pain, shortness of breath, lower extremity swelling, no vision changes, endorses occasional migraine.  She does not check her blood pressure at home, she will start to monitor her blood pressure 3-4 times per week, keep a blood pressure log, and notify the office if her blood pressure does not return to normal.  It is elevated in the office today.   Recommend moderate exercise, Dash diet,  weight loss for better blood pressure control. Follow-up in one month with blood pressure log, will adjust medications as needed. Refills sent.

## 2022-11-01 NOTE — Progress Notes (Deleted)
   New Patient Office Visit  Subjective    Patient ID: Courtney Kennedy, female    DOB: [birth year]  Age: 42 y.o. MRN: [id number]  CC:  Chief Complaint  Patient presents with   Arm Pain    HPI Courtney Kennedy presents to establish care with this practice. She is new to me.    Arm pain: ***  Elevated blood pressure: *** ***  Outpatient Encounter Medications as of 11/01/2022  Medication Sig   cetirizine (ZYRTEC) 10 MG tablet Take 10 mg by mouth daily.   fexofenadine (ALLEGRA) 180 MG tablet Take 180 mg by mouth.   hydrochlorothiazide (HYDRODIURIL) 12.5 MG tablet Take 12.5 mg by mouth daily.   levonorgestrel (MIRENA, 52 MG,) 20 MCG/DAY IUD 1 each by Intrauterine route once.   lisinopril (ZESTRIL) 10 MG tablet Take by mouth.   rizatriptan (MAXALT) 10 MG tablet Take by mouth.   lisinopril (ZESTRIL) 10 MG tablet Take 10 mg by mouth daily. (Patient not taking: Reported on 11/01/2022)   rizatriptan (MAXALT) 10 MG tablet Take 10 mg by mouth as needed for migraine. (Patient not taking: Reported on 11/01/2022)   [DISCONTINUED] meloxicam (MOBIC) 15 MG tablet One tab PO qAM with a meal for 2 weeks, then daily prn pain. (Patient not taking: Reported on 06/16/2020)   [DISCONTINUED] traMADol (ULTRAM) 50 MG tablet Take 1-2 tablets (50-100 mg total) by mouth every 8 (eight) hours as needed for moderate pain. Maximum 6 tabs per day. (Patient not taking: Reported on 06/16/2020)   No facility-administered encounter medications on file as of 11/01/2022.    Past Medical History:  Diagnosis Date   Hypertension     Past Surgical History:  Procedure Laterality Date   ANKLE SURGERY Right 2021    Family History  Problem Relation Age of Onset   Hypertension Mother    Clotting disorder Father     Social History   Socioeconomic History   Marital status: Significant Other    Spouse name: Not on file   Number of children: Not on file   Years of education: Not on file   Highest education level:  Not on file  Occupational History   Not on file  Tobacco Use   Smoking status: Never   Smokeless tobacco: Never  Vaping Use   Vaping Use: Never used  Substance and Sexual Activity   Alcohol use: Not Currently   Drug use: Never   Sexual activity: Yes  Other Topics Concern   Not on file  Social History Narrative   Not on file   Social Determinants of Health   Financial Resource Strain: Not on file  Food Insecurity: Not on file  Transportation Needs: Not on file  Physical Activity: Not on file  Stress: Not on file  Social Connections: Not on file  Intimate Partner Violence: Not on file    ROS      Objective    BP (!) 176/100   Pulse 72   Temp 98.8 F (37.1 C) (Oral)   Ht 5\' 5"  (1.651 m)   Wt 285 lb 4.8 oz (129.4 kg)   LMP  (LMP Unknown)   SpO2 99%   BMI 47.48 kg/m   Physical Exam     Assessment & Plan:   Problem List Items Addressed This Visit   None   No follow-ups on file.   Chalmers Guest, FNP

## 2022-11-01 NOTE — Progress Notes (Signed)
Complete physical exam  Patient: Courtney Kennedy   DOB: March 02, 1981   42 y.o. Female  MRN: [id number]  Subjective:    Chief Complaint  Patient presents with   Annual Exam    Pt has no concerns this morning.     Courtney Kennedy is a 42 y.o. female who presents today for a complete physical exam. She reports consuming a general diet.  Walks around property at home.   She generally feels well. She reports sleeping well. She does have additional problems to discuss today. She lives in [place], has a farm and cares for the animals. Recent birth of baby calf.   History reviewed. Hypertension on meds, migraine with aura on meds, seasonal allergies on meds.   Elevated blood pressure with diagnosis of hypertension.  Taking lisinopril 5 mg with HCTZ 12.5 mg daily as prescribed. Took medication just prior to this visit today.  Denies chest pain, no shortness of breath, no lower extremity swelling, no vision changes, occasional migraine. Does not check pressure at home, has blood pressure monitor at home.   Migraine with aura:  2 headaches per  month. Uses Maxalt with good result.  Needs refill today.   Health maintenance:  See GYN for pap, due now. Will schedule this soon.  Has IUD.    Most recent fall risk assessment:    11/01/2022    8:31 AM  [place] in the past year? 0  Number falls in past yr: 0  Injury with Fall? 0  Risk for fall due to : No Fall Risks  Follow up Falls evaluation completed     Most recent depression screenings:    11/01/2022    8:31 AM  PHQ 2/9 Scores  PHQ - 2 Score 0  PHQ- 9 Score 1    Vision:Within last year and Dental: No current dental problems and Receives regular dental care    Patient Care Team: Chalmers Guest, FNP as PCP - General (Family Medicine)   Outpatient Medications Prior to Visit  Medication Sig   cetirizine (ZYRTEC) 10 MG tablet Take 10 mg by mouth daily.   fexofenadine (ALLEGRA) 180 MG tablet Take 180 mg by mouth.    levonorgestrel (MIRENA, 52 MG,) 20 MCG/DAY IUD 1 each by Intrauterine route once.   [DISCONTINUED] hydrochlorothiazide (HYDRODIURIL) 12.5 MG tablet Take 12.5 mg by mouth daily.   [DISCONTINUED] lisinopril (ZESTRIL) 10 MG tablet Take 5 mg by mouth daily.   [DISCONTINUED] rizatriptan (MAXALT) 10 MG tablet Take by mouth.   [DISCONTINUED] lisinopril (ZESTRIL) 10 MG tablet Take 10 mg by mouth daily. (Patient not taking: Reported on 11/01/2022)   [DISCONTINUED] meloxicam (MOBIC) 15 MG tablet One tab PO qAM with a meal for 2 weeks, then daily prn pain. (Patient not taking: Reported on 06/16/2020)   [DISCONTINUED] rizatriptan (MAXALT) 10 MG tablet Take 10 mg by mouth as needed for migraine. (Patient not taking: Reported on 11/01/2022)   [DISCONTINUED] traMADol (ULTRAM) 50 MG tablet Take 1-2 tablets (50-100 mg total) by mouth every 8 (eight) hours as needed for moderate pain. Maximum 6 tabs per day. (Patient not taking: Reported on 06/16/2020)   No facility-administered medications prior to visit.    Review of Systems  Constitutional:  Negative for chills and fever.  Eyes:  Negative for blurred vision and double vision.  Respiratory:  Negative for shortness of breath.   Cardiovascular:  Negative for chest pain.  Gastrointestinal:  Negative for abdominal pain, nausea and vomiting.  Neurological:  Positive for headaches (last migraine 3-4 weeks ago, last last 1-2 hours if takes medicine in time). Negative for dizziness, sensory change, speech change and weakness.  Psychiatric/Behavioral:  Negative for depression and suicidal ideas.           Objective:     BP (!) 170/107   Pulse 72   Temp 98.8 F (37.1 C) (Oral)   Ht 5\' 5"  (1.651 m)   Wt 285 lb 4.8 oz (129.4 kg)   LMP  (LMP Unknown)   SpO2 99%   BMI 47.48 kg/m  BP Readings from Last 3 Encounters:  11/01/22 (!) 170/107  04/08/20 (!) 151/90      Physical Exam Vitals and nursing note reviewed.  Constitutional:      General: She is  not in acute distress.    Appearance: Normal appearance. She is obese.  HENT:     Right Ear: Tympanic membrane normal.     Left Ear: Tympanic membrane normal.     Nose: Nose normal.     Mouth/Throat:     Mouth: Mucous membranes are moist.     Pharynx: Oropharynx is clear. No posterior oropharyngeal erythema.  Neck:     Thyroid: No thyroid mass or thyroid tenderness.  Cardiovascular:     Rate and Rhythm: Regular rhythm.     Pulses: Normal pulses.     Heart sounds: Normal heart sounds.  Pulmonary:     Effort: Pulmonary effort is normal.     Breath sounds: Normal breath sounds.  Musculoskeletal:        General: Normal range of motion.     Right lower leg: No edema.     Left lower leg: No edema.  Lymphadenopathy:     Cervical:     Right cervical: No superficial cervical adenopathy.    Left cervical: No superficial cervical adenopathy.  Skin:    General: Skin is warm and dry.     Capillary Refill: Capillary refill takes less than 2 seconds.  Neurological:     General: No focal deficit present.     Mental Status: She is alert. Mental status is at baseline.  Psychiatric:        Mood and Affect: Mood normal.        Behavior: Behavior normal.        Thought Content: Thought content normal.        Judgment: Judgment normal.      No results found for any visits on 11/01/22.     Assessment & Plan:    Routine Health Maintenance and Physical Exam  Immunization History  Administered Date(s) Administered   Tdap 03/07/2019    Health Maintenance  Topic Date Due   HIV Screening  Never done   Hepatitis C Screening  Never done   PAP SMEAR-Modifier  Never done   DTaP/Tdap/Td (2 - Td or Tdap) 03/06/2029   HPV VACCINES  Aged Out   INFLUENZA VACCINE  Discontinued   COVID-19 Vaccine  Discontinued    Discussed health benefits of physical activity, and encouraged her to engage in regular exercise appropriate for her age and condition.  Problem List Items Addressed This Visit      Essential hypertension    Taking lisinopril 5 mg with hydrochlorothiazide 12.5 mg daily as prescribed.  Reports taking blood pressure medicine just prior to this visit.  Denies chest pain, shortness of breath, lower extremity swelling, no vision changes, endorses occasional migraine.  She does not check her blood pressure  at home, she will start to monitor her blood pressure 3-4 times per week, keep a blood pressure log, and notify the office if her blood pressure does not return to normal.  It is elevated in the office today.   Recommend moderate exercise, Dash diet,  weight loss for better blood pressure control. Follow-up in one month with blood pressure log, will adjust medications as needed. Refills sent.       Relevant Medications   lisinopril (ZESTRIL) 5 MG tablet   hydrochlorothiazide (HYDRODIURIL) 12.5 MG tablet   Other Relevant Orders   Comprehensive metabolic panel   Lipid panel   Annual physical exam - Primary   Relevant Orders   CBC with Differential/Platelet   Comprehensive metabolic panel   Hemoglobin A1c   Lipid panel   TSH + free T4   Hepatitis C antibody   HIV Antibody (routine testing w rflx)   Intractable migraine with aura without status migrainosus    Reports migraine with aura approximately 2 times per month.  Uses Maxalt with good results.  Denies weakness, sensory changes.  Refill sent.      Relevant Medications   lisinopril (ZESTRIL) 5 MG tablet   rizatriptan (MAXALT) 10 MG tablet   hydrochlorothiazide (HYDRODIURIL) 12.5 MG tablet   BMI 45.0-49.9, adult (HCC)   Relevant Orders   Comprehensive metabolic panel   Hemoglobin A1c   Lipid panel  Routine labs ordered. HCM reviewed/discussed. Anticipatory guidance regarding healthy weight, lifestyle and choices given. Pap due per GYN, will schedule.  Recommend healthy diet.  Recommend approximately 150 minutes/week of moderate intensity exercise.  Discussed healthy weight and wellness program, she will think  about it. Limit alcohol consumption: no more than one drink per day for women and 2 drinks per day for me. Recommend regular dental and vision exams. Always use seatbelt/lap and shoulder restraints. Recommend using smoke alarms and checking batteries at least twice a year. Recommend using sunscreen when outside.  Agrees with plan of care discussed.  Questions answered.  Return in about 1 month (around 12/02/2022) for HTN .     Chalmers Guest, FNP

## 2022-11-01 NOTE — Assessment & Plan Note (Signed)
Reports migraine with aura approximately 2 times per month.  Uses Maxalt with good results.  Denies weakness, sensory changes.  Refill sent.

## 2022-11-01 NOTE — Patient Instructions (Signed)
Bring blood pressure log with you at follow-up.

## 2022-11-03 ENCOUNTER — Encounter: Payer: Self-pay | Admitting: Family Medicine

## 2022-11-03 ENCOUNTER — Other Ambulatory Visit: Payer: Self-pay | Admitting: Family Medicine

## 2022-11-03 DIAGNOSIS — G43119 Migraine with aura, intractable, without status migrainosus: Secondary | ICD-10-CM

## 2022-11-03 LAB — CBC WITH DIFFERENTIAL/PLATELET
Basophils Absolute: 0 10*3/uL (ref 0.0–0.2)
Basos: 0 %
EOS (ABSOLUTE): 0.3 10*3/uL (ref 0.0–0.4)
Eos: 4 %
Hematocrit: 46 % (ref 34.0–46.6)
Hemoglobin: 14.9 g/dL (ref 11.1–15.9)
Immature Grans (Abs): 0 10*3/uL (ref 0.0–0.1)
Immature Granulocytes: 0 %
Lymphocytes Absolute: 2.3 10*3/uL (ref 0.7–3.1)
Lymphs: 30 %
MCH: 26.8 pg (ref 26.6–33.0)
MCHC: 32.4 g/dL (ref 31.5–35.7)
MCV: 83 fL (ref 79–97)
Monocytes Absolute: 0.4 10*3/uL (ref 0.1–0.9)
Monocytes: 6 %
Neutrophils Absolute: 4.5 10*3/uL (ref 1.4–7.0)
Neutrophils: 60 %
Platelets: 415 10*3/uL (ref 150–450)
RBC: 5.57 x10E6/uL — ABNORMAL HIGH (ref 3.77–5.28)
RDW: 12.7 % (ref 11.7–15.4)
WBC: 7.5 10*3/uL (ref 3.4–10.8)

## 2022-11-03 LAB — COMPREHENSIVE METABOLIC PANEL
ALT: 34 IU/L — ABNORMAL HIGH (ref 0–32)
AST: 21 IU/L (ref 0–40)
Albumin/Globulin Ratio: 1.6 (ref 1.2–2.2)
Albumin: 4.5 g/dL (ref 3.9–4.9)
Alkaline Phosphatase: 53 IU/L (ref 44–121)
BUN/Creatinine Ratio: 14 (ref 9–23)
BUN: 11 mg/dL (ref 6–24)
Bilirubin Total: 0.5 mg/dL (ref 0.0–1.2)
CO2: 23 mmol/L (ref 20–29)
Calcium: 9.8 mg/dL (ref 8.7–10.2)
Chloride: 98 mmol/L (ref 96–106)
Creatinine, Ser: 0.81 mg/dL (ref 0.57–1.00)
Globulin, Total: 2.9 g/dL (ref 1.5–4.5)
Glucose: 89 mg/dL (ref 70–99)
Potassium: 4.3 mmol/L (ref 3.5–5.2)
Sodium: 137 mmol/L (ref 134–144)
Total Protein: 7.4 g/dL (ref 6.0–8.5)
eGFR: 93 mL/min/{1.73_m2} (ref >59)

## 2022-11-03 LAB — LIPID PANEL
Chol/HDL Ratio: 5.2 ratio — ABNORMAL HIGH (ref 0.0–4.4)
Cholesterol, Total: 222 mg/dL — ABNORMAL HIGH (ref 100–199)
HDL: 43 mg/dL (ref >39)
LDL Chol Calc (NIH): 148 mg/dL — ABNORMAL HIGH (ref 0–99)
Triglycerides: 169 mg/dL — ABNORMAL HIGH (ref 0–149)
VLDL Cholesterol Cal: 31 mg/dL (ref 5–40)

## 2022-11-03 LAB — TSH+FREE T4
Free T4: 1.47 ng/dL (ref 0.82–1.77)
TSH: 1.65 u[IU]/mL (ref 0.450–4.500)

## 2022-11-03 LAB — HEPATITIS C ANTIBODY: Hep C Virus Ab: NONREACTIVE

## 2022-11-03 LAB — HEMOGLOBIN A1C
Est. average glucose Bld gHb Est-mCnc: 111 mg/dL
Hgb A1c MFr Bld: 5.5 % (ref 4.8–5.6)

## 2022-11-03 LAB — HIV ANTIBODY (ROUTINE TESTING W REFLEX): HIV Screen 4th Generation wRfx: NONREACTIVE

## 2022-11-03 MED ORDER — RIZATRIPTAN BENZOATE 10 MG PO TABS: 10.0000 mg | ORAL_TABLET | ORAL | 0 refills | Status: DC | PRN

## 2022-11-03 NOTE — Progress Notes (Signed)
Frequency changed.

## 2022-12-06 ENCOUNTER — Ambulatory Visit: Payer: 59 | Admitting: Family Medicine

## 2022-12-14 ENCOUNTER — Encounter: Payer: Self-pay | Admitting: Family Medicine

## 2022-12-14 ENCOUNTER — Ambulatory Visit: Payer: 59 | Admitting: Family Medicine

## 2022-12-14 VITALS — BP 160/114 | HR 99 | Temp 98.4°F | Resp 18 | Ht 65.0 in | Wt 286.8 lb

## 2022-12-14 DIAGNOSIS — W540XXA Bitten by dog, initial encounter: Secondary | ICD-10-CM | POA: Diagnosis not present

## 2022-12-14 DIAGNOSIS — I10 Essential (primary) hypertension: Secondary | ICD-10-CM

## 2022-12-14 DIAGNOSIS — S41151A Open bite of right upper arm, initial encounter: Secondary | ICD-10-CM | POA: Diagnosis not present

## 2022-12-14 MED ORDER — LISINOPRIL-HYDROCHLOROTHIAZIDE 10-12.5 MG PO TABS: 1.0000 | ORAL_TABLET | Freq: Every day | ORAL | 1 refills | Status: DC

## 2022-12-14 MED ORDER — AMOXICILLIN-POT CLAVULANATE 875-125 MG PO TABS: 1.0000 | ORAL_TABLET | Freq: Two times a day (BID) | ORAL | 0 refills | Status: DC

## 2022-12-14 NOTE — Patient Instructions (Signed)
Healthy Heart:  Recommend heart healthy/Mediterranean diet, with whole grains, fruits, vegetable, fish, lean meats, nuts, and olive oil. Limit salt. Recommend moderate walking, 3-5 times/week for 30-50 minutes each session. Aim for at least 150 minutes.week. Goal should be pace of 3 miles/hours, or walking 1.5 miles in 30 minutes Recommend avoidance of tobacco products. Avoid excess alcohol.  

## 2022-12-14 NOTE — Progress Notes (Signed)
Established Patient Office Visit  Subjective   Patient ID: Courtney Kennedy, female    DOB: 1980/10/12  Age: 42 y.o. MRN: [id number]  Chief Complaint  Patient presents with   Follow-up    Patient is here for a follow up appointment for HTN, Patient also has concerns about her dog bit her a couple of weeks ago, scar is healing but did have some pus     HPI  Hypertension Medication compliance: lisinopril 5 mg and HCTZ 12.5 daily as prescribed.  Denies chest pain, shortness of breath, lower extremity edema, vision changes, headaches.  Pertinent lab work: 11/01/22,  normal kidney and potassium levels.  Monitoring at home: not monitoring at home Tolerating medication well: no side effects reported  Continue current medication regimen: increase lisinopril 10 mg as blood pressure is not controlled.  Follow-up: one month  Dog bite: right forearm. Purulent drainage yesterday. Happened one week ago, patients dog, up to date on rabies.    Review of Systems  Constitutional:  Negative for chills and fever.  Eyes:  Negative for blurred vision and double vision.  Respiratory:  Negative for shortness of breath.   Cardiovascular:  Negative for chest pain.  Skin:        Right forearm dog bite   Neurological:  Negative for dizziness and headaches.      Objective:     BP (!) 160/114   Pulse 99   Temp 98.4 F (36.9 C) (Oral)   Resp 18   Ht 5\' 5"  (1.651 m)   Wt 286 lb 12.8 oz (130.1 kg)   SpO2 99%   BMI 47.73 kg/m  BP Readings from Last 3 Encounters:  12/14/22 (!) 160/114  11/01/22 (!) 170/107  04/08/20 (!) 151/90      Physical Exam Vitals and nursing note reviewed.  Constitutional:      Appearance: Normal appearance. She is obese.  Cardiovascular:     Rate and Rhythm: Regular rhythm.     Heart sounds: Normal heart sounds.  Pulmonary:     Effort: Pulmonary effort is normal.     Breath sounds: Normal breath sounds.  Skin:    General: Skin is warm and dry.     Capillary  Refill: Capillary refill takes less than 2 seconds.     Findings: Erythema and wound present.     Comments: Right forearm with erythema and scabbed area warm to touch from dog bite one week ago.   Neurological:     General: No focal deficit present.     Mental Status: She is alert. Mental status is at baseline.  Psychiatric:        Mood and Affect: Mood normal.        Behavior: Behavior normal.        Thought Content: Thought content normal.        Judgment: Judgment normal.     No results found for any visits on 12/14/22.  Last metabolic panel Lab Results  Component Value Date   GLUCOSE 89 11/01/2022   NA 137 11/01/2022   K 4.3 11/01/2022   CL 98 11/01/2022   CO2 23 11/01/2022   BUN 11 11/01/2022   CREATININE 0.81 11/01/2022   EGFR 93 11/01/2022   CALCIUM 9.8 11/01/2022   PROT 7.4 11/01/2022   ALBUMIN 4.5 11/01/2022   LABGLOB 2.9 11/01/2022   AGRATIO 1.6 11/01/2022   BILITOT 0.5 11/01/2022   ALKPHOS 53 11/01/2022   AST 21 11/01/2022   ALT 34 (H)  11/01/2022      The 10-year ASCVD risk score (Arnett DK, et al., 2019) is: 2.6%    Assessment & Plan:   Problem List Items Addressed This Visit     Essential hypertension - Primary  Blood pressure continues to be elevated. Denies chest pain, shortness of breath, lower extremity edema, vision changes, and headaches. Kidney function normal, potassium level 4.3 on 3/20. Will increase lisinopril to 10 mg continue HCTZ 12.5 mg. Encouraged patient to monitor blood pressure at home and to keep log. Information provided on DASH diet and moderate exercise program. Follow-up in one month.    Relevant Medications   lisinopril-hydrochlorothiazide (ZESTORETIC) 10-12.5 MG tablet   Dog bite of arm, right, initial encounter  Patients dog bit her one week ago on right forearm.  Dog is up to date on rabies vaccine. Area erythematous with warmth to touch, scabbed over. Reports purulent drainage yesterday, none visualized today. Augmentin  875-125 mg BID x 10 days. Follow-up if not improved or worse. Keep area clean.    Relevant Medications   amoxicillin-clavulanate (AUGMENTIN) 875-125 MG tablet  Agrees with plan of care discussed.  Questions answered.   Return in about 4 weeks (around 01/11/2023) for HTN .    Novella Olive, FNP

## 2022-12-19 ENCOUNTER — Ambulatory Visit: Payer: 59 | Admitting: Family Medicine

## 2023-01-18 ENCOUNTER — Ambulatory Visit: Payer: 59 | Admitting: Family Medicine

## 2023-01-25 ENCOUNTER — Ambulatory Visit
Admission: EM | Admit: 2023-01-25 | Discharge: 2023-01-25 | Disposition: A | Discharge status: Home / Self Care | Payer: 59 | Attending: Family Medicine | Admitting: Family Medicine

## 2023-01-25 ENCOUNTER — Other Ambulatory Visit: Payer: Self-pay

## 2023-01-25 ENCOUNTER — Encounter: Payer: Self-pay | Admitting: Emergency Medicine

## 2023-01-25 DIAGNOSIS — R1030 Lower abdominal pain, unspecified: Secondary | ICD-10-CM

## 2023-01-25 NOTE — ED Triage Notes (Signed)
Patient c/o lower abdominal and back pain since this morning.  Patient did vomit around 10am.  Fever of 102 Monday, 101 Tuesday.  Patient has taken Tylenol.

## 2023-01-25 NOTE — ED Provider Notes (Signed)
[facility]    CSN: [id number] Arrival date & time: 01/25/23  1850      History   Chief Complaint Chief Complaint  Patient presents with   Abdominal Pain    HPI Courtney Kennedy is a 42 y.o. female.   Patient reports that she developed cold-like symptoms about a week ago that only lasted about 3 days.  Four days ago she developed chills/sweats, followed by fever to 102.1 the next day.  During this time she had no other symptoms except fatigue.  Yesterday she had fever 99.8.  This morning at 5am she awoke with bilateral lower abdominal pain and vague mid low back pain.  She had an episode of nausea/vomiting at 10am today.  Her abdominal pain became worse at about 4pm today, worse with movement.  Her stools have been somewhat loose today but she denies urinary symptoms.  She denies vaginal discharge.  No LMP recorded. (Menstrual status: IUD).  She has no history of abdominal surgery. Family history of severe diverticulitis (father).  The history is provided by the patient and the spouse.  Abdominal Pain Pain location:  LLQ and RLQ Pain quality: aching   Pain radiates to:  Back Pain severity:  Severe Onset quality:  Gradual Duration:  15 hours Timing:  Constant Progression:  Worsening Chronicity:  New Context: awakening from sleep and recent illness   Context: not diet changes, not eating, not previous surgeries, not recent travel, not sick contacts, not suspicious food intake and not trauma   Relieved by:  Nothing Worsened by:  Movement Ineffective treatments:  None tried Associated symptoms: anorexia, chills, fatigue, fever, nausea and vomiting   Associated symptoms: no chest pain, no constipation, no cough, no diarrhea, no dysuria, no flatus, no hematemesis, no hematochezia, no hematuria, no melena, no shortness of breath, no sore throat, no vaginal bleeding and no vaginal discharge   Risk factors: obesity     Past Medical History:  Diagnosis Date    Hypertension    Migraine     Patient Active Problem List   Diagnosis Date Noted   Dog bite of arm, right, initial encounter 12/14/2022   Annual physical exam 11/01/2022   Intractable migraine with aura without status migrainosus 11/01/2022   BMI 45.0-49.9, adult (HCC) 11/01/2022   Chondromalacia of patellofemoral joint, left 06/11/2020   Osteochondral defect of right medial talus 04/26/2020   Mixed hyperlipidemia 05/25/2016   Essential hypertension 09/23/2015    Past Surgical History:  Procedure Laterality Date   ANKLE SURGERY Right 2021    OB History   No obstetric history on file.      Home Medications    Prior to Admission medications   Medication Sig Start Date End Date Taking? Authorizing Provider  cetirizine (ZYRTEC) 10 MG tablet Take 10 mg by mouth daily.   Yes [provider]  fexofenadine (ALLEGRA) 180 MG tablet Take 180 mg by mouth. 09/23/15  Yes [provider]  levonorgestrel (MIRENA, 52 MG,) 20 MCG/DAY IUD 1 each by Intrauterine route once. 09/23/15  Yes [provider]  lisinopril-hydrochlorothiazide (ZESTORETIC) 10-12.5 MG tablet Take 1 tablet by mouth daily. 12/14/22  Yes Novella Olive, FNP  rizatriptan (MAXALT) 10 MG tablet Take 1 tablet (10 mg total) by mouth as needed for migraine. May repeat dose in two hours if headache not resolved. 11/03/22  Yes Novella Olive, FNP  amoxicillin-clavulanate (AUGMENTIN) 875-125 MG tablet Take 1 tablet by mouth 2 (two) times daily. 12/14/22   Dolby,  Servando Snare, FNP    Family History Family History  Problem Relation Age of Onset   Hypertension Mother    Clotting disorder Father     Social History Social History   Tobacco Use   Smoking status: Never   Smokeless tobacco: Never  Vaping Use   Vaping Use: Never used  Substance Use Topics   Alcohol use: Not Currently   Drug use: Never     Allergies   Patient has no known allergies.   Review of Systems Review of Systems  Constitutional:   Positive for activity change, appetite change, chills, diaphoresis, fatigue and fever.  HENT:  Negative for sore throat.   Eyes: Negative.   Respiratory:  Negative for cough and shortness of breath.   Cardiovascular:  Negative for chest pain.  Gastrointestinal:  Positive for abdominal pain, anorexia, nausea and vomiting. Negative for abdominal distention, constipation, diarrhea, flatus, hematemesis, hematochezia and melena.  Genitourinary:  Negative for dysuria, flank pain, frequency, hematuria, pelvic pain, urgency, vaginal bleeding and vaginal discharge.  Musculoskeletal: Negative.   Skin: Negative.   Neurological: Negative.   Hematological:  Negative for adenopathy.     Physical Exam Triage Vital Signs ED Triage Vitals  Enc Vitals Group     BP 01/25/23 1903 (!) 168/97     Pulse Rate 01/25/23 1903 92     Resp 01/25/23 1903 18     Temp 01/25/23 1903 100.1 F (37.8 C)     Temp Source 01/25/23 1903 Oral     SpO2 01/25/23 1903 99 %     Weight 01/25/23 1905 260 lb (117.9 kg)     Height 01/25/23 1905 5\' 5"  (1.651 m)     Head Circumference --      Peak Flow --      Pain Score 01/25/23 1905 8     Pain Loc --      Pain Edu? --      Excl. in GC? --    No data found.  Updated Vital Signs BP (!) 168/97 (BP Location: Left Arm)   Pulse 92   Temp 100.1 F (37.8 C) (Oral)   Resp 18   Ht 5\' 5"  (1.651 m)   Wt 117.9 kg   SpO2 99%   BMI 43.27 kg/m   Visual Acuity Right Eye Distance:   Left Eye Distance:   Bilateral Distance:    Right Eye Near:   Left Eye Near:    Bilateral Near:     Physical Exam Constitutional:      General: She is in acute distress.     Appearance: She is obese. She is ill-appearing.  HENT:     Head: Normocephalic.     Mouth/Throat:     Mouth: Mucous membranes are moist.  Eyes:     Extraocular Movements: Extraocular movements intact.  Cardiovascular:     Rate and Rhythm: Normal rate and regular rhythm.     Heart sounds: Normal heart sounds.   Pulmonary:     Breath sounds: Normal breath sounds.  Abdominal:     General: Abdomen is protuberant. Bowel sounds are increased. There is distension.     Palpations: Abdomen is soft. There is no hepatomegaly, splenomegaly or mass.     Tenderness: There is abdominal tenderness in the right lower quadrant, periumbilical area and left lower quadrant. There is no right CVA tenderness, left CVA tenderness or rebound. Negative signs include Murphy's sign, McBurney's sign, psoas sign and obturator sign.     Hernia:  There is no hernia in the umbilical area or ventral area.       Comments: Maximal tenderness to palpation left lower quadrant.  Musculoskeletal:     Lumbar back: No tenderness.  Neurological:     Mental Status: She is alert.      UC Treatments / Results  Labs (all labs ordered are listed, but only abnormal results are displayed) Labs Reviewed - No data to display  EKG   Radiology No results found.  Procedures Procedures (including critical care time)  Medications Ordered in UC Medications - No data to display  Initial Impression / Assessment and Plan / UC Course  I have reviewed the triage vital signs and the nursing notes.  Pertinent labs & imaging results that were available during my care of the patient were reviewed by me and considered in my medical decision making (see chart for details).    Suspect acute diverticulitis, possibly with abscess Patient advised to proceed immediately to [facility] ED.  Vital signs stable.  Patient's husband to drive.  Final Clinical Impressions(s) / UC Diagnoses   Final diagnoses:  Lower abdominal pain   Discharge Instructions   None    ED Prescriptions   None       Lattie Haw, MD 01/25/23 2004

## 2023-01-25 NOTE — ED Notes (Signed)
Patient is being discharged from the Urgent Care and sent to the Emergency Department via private vehicle . Per Dr Cathren Harsh, patient is in need of higher level of care due to further evaluation. Patient is aware and verbalizes understanding of plan of care.  Vitals:   01/25/23 1903  BP: (!) 168/97  Pulse: 92  Resp: 18  Temp: 100.1 F (37.8 C)  SpO2: 99%

## 2023-01-27 ENCOUNTER — Other Ambulatory Visit: Payer: Self-pay | Admitting: Family Medicine

## 2023-01-27 DIAGNOSIS — I10 Essential (primary) hypertension: Secondary | ICD-10-CM

## 2023-01-29 ENCOUNTER — Encounter: Payer: Self-pay | Admitting: Family Medicine

## 2023-01-31 ENCOUNTER — Encounter: Payer: Self-pay | Admitting: Family Medicine

## 2023-01-31 ENCOUNTER — Ambulatory Visit: Payer: 59 | Admitting: Family Medicine

## 2023-01-31 VITALS — BP 156/93 | HR 82 | Ht 65.0 in | Wt 273.0 lb

## 2023-01-31 DIAGNOSIS — M519 Unspecified thoracic, thoracolumbar and lumbosacral intervertebral disc disorder: Secondary | ICD-10-CM | POA: Insufficient documentation

## 2023-01-31 DIAGNOSIS — R110 Nausea: Secondary | ICD-10-CM | POA: Insufficient documentation

## 2023-01-31 DIAGNOSIS — I10 Essential (primary) hypertension: Secondary | ICD-10-CM

## 2023-01-31 DIAGNOSIS — Z09 Encounter for follow-up examination after completed treatment for conditions other than malignant neoplasm: Secondary | ICD-10-CM | POA: Insufficient documentation

## 2023-01-31 MED ORDER — LOSARTAN POTASSIUM-HCTZ 50-12.5 MG PO TABS
1.0000 | ORAL_TABLET | Freq: Every day | ORAL | 0 refills | Status: DC
Start: 2023-01-31 — End: 2023-02-22

## 2023-01-31 MED ORDER — ONDANSETRON 8 MG PO TBDP
8.0000 mg | ORAL_TABLET | Freq: Three times a day (TID) | ORAL | 0 refills | Status: DC | PRN
Start: 2023-01-31 — End: 2023-02-06

## 2023-01-31 NOTE — Progress Notes (Unsigned)
hopsi

## 2023-01-31 NOTE — Progress Notes (Signed)
Established Patient Office Visit  Subjective   Patient ID: Courtney Kennedy, female    DOB: [birth year]  Age: 42 y.o. MRN: [id number]  Chief Complaint  Patient presents with   Hospitalization Follow-up    HPI Presents today for hospital follow-up.  Hospital 6/14-6/17 for tubo-ovarian abscess, treated with antibiotics.  Still taking oral Doxycycline and Flagyl. This is causing some nausea.  Has appointment on 7/5 with GYN. Taking ibuprofen during day and oxycodone at night. Taking temperature 3 times per day and does not report fever. She did not have surgery to drain abscess. She is feeling better but continues to recover.  CT scan during hospital stay:  1. Removal of intrauterine device.  2.  Stable enlarged bilateral adnexa with multiloculated appearance and surrounding inflammatory change measuring up to 6.9 cm on the left and 6.4 cm on the right, concerning for possible bilateral tubo-ovarian abscesses/PID, though other etiologies are possible.  3.  Stable 3.5 cm intramural lesion in the left uterus, probably a fibroid. Pelvic ultrasound could be a full for further characterization.  4.  Stable prominent left retroperitoneal lymphadenopathy, probably reactive.  5.  Stable thickening of the sigmoid colon, possibly reactive to pelvic inflammatory changes or could be due to colitis.  6.  Stable nonspecific 0.8 cm lesion in L3 vertebral body. Consider radiographic follow-up.  Will order lumbar x-ray today to follow-up on # 6 above.    Review of Systems  Constitutional:  Negative for chills and fever.  Respiratory:  Negative for shortness of breath.   Cardiovascular:  Negative for chest pain.  Gastrointestinal:  Positive for nausea. Negative for abdominal pain and vomiting.  Genitourinary:  Negative for dysuria.      Objective:     BP (!) 156/93   Pulse 82   Ht 5\' 5"  (1.651 m)   Wt 273 lb (123.8 kg)   SpO2 99%   BMI 45.43 kg/m  BP Readings from Last 3 Encounters:   01/31/23 (!) 156/93  01/25/23 (!) 168/97  12/14/22 (!) 160/114      Physical Exam Vitals and nursing note reviewed.  Constitutional:      General: She is not in acute distress.    Appearance: Normal appearance. She is obese.  Cardiovascular:     Rate and Rhythm: Regular rhythm.     Heart sounds: Normal heart sounds.  Pulmonary:     Effort: Pulmonary effort is normal.     Breath sounds: Normal breath sounds.  Skin:    General: Skin is warm and dry.  Neurological:     General: No focal deficit present.     Mental Status: She is alert. Mental status is at baseline.  Psychiatric:        Mood and Affect: Mood normal.        Behavior: Behavior normal.        Thought Content: Thought content normal.        Judgment: Judgment normal.     No results found for any visits on 01/31/23.    The 10-year ASCVD risk score (Arnett DK, et al., 2019) is: 2.5%    Assessment & Plan:   Problem List Items Addressed This Visit     Essential hypertension   Relevant Medications   losartan-hydrochlorothiazide (HYZAAR) 50-12.5 MG tablet   Hospital discharge follow-up - Primary   Lumbar disc lesion   Relevant Orders   DG Lumbar Spine Complete   Nausea in adult patient   Relevant Medications   ondansetron (  ZOFRAN-ODT) 8 MG disintegrating tablet  She will get x-ray of lumbar to follow-up on CT scan done in hospital. Next steps to be determined once results are available.  She will take Zofran to help with nausea while taking antibiotics. She will continue to monitor temperature and report fevers immediately. She will stop lisinopril and start losartan-hydrochlorothiazide 50-12.5 mg for better blood pressure control. Follow-up in 3 weeks, keep blood pressure log. She will notify provider sooner if blood pressures are not responding to medication changes. Denies chest pain, shortness of breath, vision changes, and headaches. DASH diet.  Agrees with plan of care discussed.  Questions  answered.    Return in about 3 weeks (around 02/21/2023) for htn .    Novella Olive, FNP

## 2023-02-01 ENCOUNTER — Telehealth: Payer: Self-pay

## 2023-02-01 ENCOUNTER — Encounter: Payer: Self-pay | Admitting: Family Medicine

## 2023-02-01 NOTE — Telephone Encounter (Signed)
Patient called stating that she thinks she was having a reaction from the new BP medication that she started yesterday Losartan-hydrochlorothiazide , her symptoms were  left sided Headache and really bad neck stiffness, Advised patient of what Dorena Bodo FNP stated that she should stop new medication for now and go back on Lisinopril and see if those symptoms go away. Patient voiced a verbal understanding.

## 2023-02-04 ENCOUNTER — Encounter: Payer: Self-pay | Admitting: Family Medicine

## 2023-02-06 ENCOUNTER — Other Ambulatory Visit: Payer: Self-pay | Admitting: Family Medicine

## 2023-02-06 DIAGNOSIS — R110 Nausea: Secondary | ICD-10-CM

## 2023-02-06 MED ORDER — PROMETHAZINE HCL 25 MG PO TABS
25.0000 mg | ORAL_TABLET | Freq: Three times a day (TID) | ORAL | 0 refills | Status: DC | PRN
Start: 2023-02-06 — End: 2024-01-24

## 2023-02-07 ENCOUNTER — Ambulatory Visit (INDEPENDENT_AMBULATORY_CARE_PROVIDER_SITE_OTHER): Payer: 59

## 2023-02-07 DIAGNOSIS — M519 Unspecified thoracic, thoracolumbar and lumbosacral intervertebral disc disorder: Secondary | ICD-10-CM

## 2023-02-16 ENCOUNTER — Encounter: Payer: Self-pay | Admitting: Family Medicine

## 2023-02-19 ENCOUNTER — Other Ambulatory Visit: Payer: Self-pay | Admitting: Family Medicine

## 2023-02-19 DIAGNOSIS — M519 Unspecified thoracic, thoracolumbar and lumbosacral intervertebral disc disorder: Secondary | ICD-10-CM

## 2023-02-21 ENCOUNTER — Ambulatory Visit: Payer: 59 | Admitting: Family Medicine

## 2023-02-22 ENCOUNTER — Ambulatory Visit: Payer: 59 | Admitting: Family Medicine

## 2023-02-22 ENCOUNTER — Other Ambulatory Visit: Payer: Self-pay | Admitting: Family Medicine

## 2023-02-22 ENCOUNTER — Encounter: Payer: Self-pay | Admitting: Family Medicine

## 2023-02-22 VITALS — BP 140/86 | HR 79 | Temp 98.5°F | Resp 20 | Ht 65.0 in | Wt 275.4 lb

## 2023-02-22 DIAGNOSIS — I10 Essential (primary) hypertension: Secondary | ICD-10-CM

## 2023-02-22 MED ORDER — LISINOPRIL-HYDROCHLOROTHIAZIDE 10-12.5 MG PO TABS
1.0000 | ORAL_TABLET | Freq: Every day | ORAL | 0 refills | Status: DC
Start: 2023-02-22 — End: 2023-05-08

## 2023-02-22 NOTE — Progress Notes (Signed)
   Established Patient Office Visit  Subjective   Patient ID: Courtney Kennedy, female    DOB: 11-Nov-1980  Age: 42 y.o. MRN: [id number]  Chief Complaint  Patient presents with   Follow-up    Patient is here for a HTN follow up     HPI  Hypertension Medication compliance: taking lisinopril-hydrochlorothiazide 10-12.5 mg daily as prescribed  Denies chest pain, shortness of breath, lower extremity edema, vision changes, headaches.  Pertinent lab work: CMP 6/13, K+ 3.6, will recheck BMP this week.  Monitoring at home: 140/80's  Tolerating medication well: no side effects reported  Continue current medication regimen: no changes Follow-up: 3 months   Right ankle pain: pain with prolonged standing. Has ortho already, planning to go soon for evaluation. Will consult with ortho concerning lumbar concerns. MRI ordered to evaluate further, will ask ortho.    Review of Systems  Eyes:  Negative for blurred vision and double vision.  Respiratory:  Negative for shortness of breath.   Cardiovascular:  Negative for chest pain and leg swelling.  Neurological:  Negative for dizziness and headaches.      Objective:     BP (!) 140/86 (BP Location: Right Arm, Patient Position: Sitting, Cuff Size: Large)   Pulse 79   Temp 98.5 F (36.9 C) (Oral)   Resp 20   Ht 5\' 5"  (1.651 m)   Wt 275 lb 6.4 oz (124.9 kg)   SpO2 99%   BMI 45.83 kg/m  BP Readings from Last 3 Encounters:  02/22/23 (!) 140/86  01/31/23 (!) 156/93  01/25/23 (!) 168/97      Physical Exam Vitals and nursing note reviewed.  Constitutional:      General: She is not in acute distress.    Appearance: Normal appearance. She is obese.  Cardiovascular:     Rate and Rhythm: Normal rate and regular rhythm.     Heart sounds: Normal heart sounds.  Pulmonary:     Effort: Pulmonary effort is normal.     Breath sounds: Normal breath sounds.  Skin:    General: Skin is warm and dry.  Neurological:     General: No focal  deficit present.     Mental Status: She is alert. Mental status is at baseline.  Psychiatric:        Mood and Affect: Mood normal.        Behavior: Behavior normal.        Thought Content: Thought content normal.        Judgment: Judgment normal.     No results found for any visits on 02/22/23.    The 10-year ASCVD risk score (Arnett DK, et al., 2019) is: 2%    Assessment & Plan:   Problem List Items Addressed This Visit     Essential hypertension - Primary  Taking lisinopril-hydrochlorothiazide  10-12.5 mg daily as prescribed.  Denies chest pain, shortness of breath, lower extremity edema, vision changes, headaches. Last potassium level while in hospital 3.6, will recheck BMP 7/12. Increase potassium rich foods. Blood pressure well controlled in office. No changes in medication today. DASH/heart healthy diet. Moderate exercise once fully recovered from recent abscess.    Relevant Medications   lisinopril-hydrochlorothiazide (ZESTORETIC) 10-12.5 MG tablet   Other Relevant Orders   Basic Metabolic Panel (BMET)  Agrees with plan of care discussed.  Questions answered.   Return in about 3 months (around 05/25/2023) for htn.    Novella Olive, FNP

## 2023-02-27 LAB — BASIC METABOLIC PANEL
BUN/Creatinine Ratio: 19 (ref 9–23)
BUN: 14 mg/dL (ref 6–24)
CO2: 20 mmol/L (ref 20–29)
Calcium: 9.6 mg/dL (ref 8.7–10.2)
Chloride: 102 mmol/L (ref 96–106)
Creatinine, Ser: 0.74 mg/dL (ref 0.57–1.00)
Glucose: 105 mg/dL — ABNORMAL HIGH (ref 70–99)
Potassium: 4.4 mmol/L (ref 3.5–5.2)
Sodium: 138 mmol/L (ref 134–144)
eGFR: 104 mL/min/{1.73_m2} (ref >59)

## 2023-03-19 ENCOUNTER — Encounter: Payer: Self-pay | Admitting: Family Medicine

## 2023-03-19 ENCOUNTER — Ambulatory Visit: Payer: 59 | Admitting: Family Medicine

## 2023-03-19 VITALS — BP 135/82 | HR 80 | Temp 98.3°F | Resp 18 | Ht 65.0 in | Wt 276.5 lb

## 2023-03-19 DIAGNOSIS — R110 Nausea: Secondary | ICD-10-CM

## 2023-03-19 DIAGNOSIS — N7093 Salpingitis and oophoritis, unspecified: Secondary | ICD-10-CM

## 2023-03-19 DIAGNOSIS — R1084 Generalized abdominal pain: Secondary | ICD-10-CM | POA: Diagnosis not present

## 2023-03-19 NOTE — Progress Notes (Signed)
Established Patient Office Visit  Subjective   Patient ID: Courtney Kennedy, female    DOB: 31-Oct-1980  Age: 42 y.o. MRN: [id number]  Chief Complaint  Patient presents with   Abdominal Pain    Patient states that she has been having issues with her stomach for the last week now, she states that Her Bowel movements are very painful, she gets cold sweats, and nauseated when she has a BM, She also states that she doesn't know if this comes from when she was in the hospital about a month in half ago due to her ovaries and was put on a lot of antibiotics.     HPI Having abdominal pain with bowel movements, getting chills, and nausea, occures for 2-3 hours during that time, then symptoms resolve and she feels better, this occurred on Tuesday, Thursday, and Friday, does not feel well today. No fever. Keeping fluids down.   Some looseness with bowel movements, not hard. Does not think this is constipation. Able to feel fluids and food down.  History of recent tobo-ovarian abscess, hospitalized for 4 days. Sees GYN in 2 weeks for transvaginal ultrasound on 8/20.         Review of Systems  Constitutional:  Positive for chills. Negative for fever.  Gastrointestinal:  Positive for abdominal pain (generalized), diarrhea and nausea. Negative for blood in stool, constipation and vomiting.       Keeping fluids down.  Genitourinary:  Negative for dysuria, frequency and urgency.      Objective:     BP 135/82   Pulse 80   Temp 98.3 F (36.8 C) (Oral)   Resp 18   Ht 5\' 5"  (1.651 m)   Wt 276 lb 8 oz (125.4 kg)   SpO2 99%   BMI 46.01 kg/m  BP Readings from Last 3 Encounters:  03/19/23 135/82  02/22/23 (!) 140/86  01/31/23 (!) 156/93      Physical Exam Vitals and nursing note reviewed.  Constitutional:      General: She is not in acute distress.    Appearance: She is well-developed. She is not ill-appearing, toxic-appearing or diaphoretic.  Cardiovascular:     Rate and Rhythm:  Normal rate and regular rhythm.     Heart sounds: Normal heart sounds.  Pulmonary:     Effort: Pulmonary effort is normal.     Breath sounds: Normal breath sounds.  Abdominal:     General: Bowel sounds are normal.     Palpations: Abdomen is soft.     Tenderness: There is generalized abdominal tenderness. There is no guarding or rebound.  Neurological:     Mental Status: She is alert.     No results found for any visits on 03/19/23.    The 10-year ASCVD risk score (Arnett DK, et al., 2019) is: 1.9%    Assessment & Plan:   Generalized abdominal pain -     CBC with Differential/Platelet -     Comprehensive metabolic panel -     US Abdomen Complete  Tubo-ovarian abscess -     CBC with Differential/Platelet -     Comprehensive metabolic panel  Nausea in adult patient -     CBC with Differential/Platelet -     Comprehensive metabolic panel -     US Abdomen Complete    Will get labs today. Abdominal ultrasound. Has transvaginal ultrasound scheduled in 2 weeks with GYN to assess recent abscess. Symptoms reviewed that warrant seeking higher level of care. No fever  currently, does not appear ill or toxic. Vital signs are stable.  Next steps determined by results of test today. Agrees with plan of care discussed.  Questions answered.   Return if symptoms worsen or fail to improve.    Courtney Kennedy, Courtney Kennedy

## 2023-03-20 ENCOUNTER — Telehealth: Payer: Self-pay

## 2023-03-20 ENCOUNTER — Encounter: Payer: Self-pay | Admitting: Family Medicine

## 2023-03-20 NOTE — Telephone Encounter (Signed)
Spoke with patient and she is aware of the date and time of her Ultrasound

## 2023-03-21 ENCOUNTER — Ambulatory Visit (INDEPENDENT_AMBULATORY_CARE_PROVIDER_SITE_OTHER): Payer: 59

## 2023-03-21 DIAGNOSIS — R1084 Generalized abdominal pain: Secondary | ICD-10-CM | POA: Diagnosis not present

## 2023-03-21 DIAGNOSIS — R110 Nausea: Secondary | ICD-10-CM | POA: Diagnosis not present

## 2023-04-09 ENCOUNTER — Ambulatory Visit (INDEPENDENT_AMBULATORY_CARE_PROVIDER_SITE_OTHER): Payer: 59

## 2023-04-09 DIAGNOSIS — M519 Unspecified thoracic, thoracolumbar and lumbosacral intervertebral disc disorder: Secondary | ICD-10-CM

## 2023-04-09 MED ORDER — GADOBUTROL 1 MMOL/ML IV SOLN
10.0000 mL | Freq: Once | INTRAVENOUS | Status: AC | PRN
  Administered 2023-04-09: 10 mL via INTRAVENOUS

## 2023-04-30 ENCOUNTER — Other Ambulatory Visit: Payer: Self-pay | Admitting: Family Medicine

## 2023-04-30 DIAGNOSIS — I10 Essential (primary) hypertension: Secondary | ICD-10-CM

## 2023-05-08 ENCOUNTER — Ambulatory Visit: Payer: 59 | Admitting: Family Medicine

## 2023-05-08 ENCOUNTER — Encounter: Payer: Self-pay | Admitting: Family Medicine

## 2023-05-08 DIAGNOSIS — G43119 Migraine with aura, intractable, without status migrainosus: Secondary | ICD-10-CM | POA: Diagnosis not present

## 2023-05-08 DIAGNOSIS — I10 Essential (primary) hypertension: Secondary | ICD-10-CM

## 2023-05-08 MED ORDER — RIZATRIPTAN BENZOATE 10 MG PO TABS: 10.0000 mg | ORAL_TABLET | ORAL | 0 refills | Status: AC | PRN

## 2023-05-08 MED ORDER — LISINOPRIL-HYDROCHLOROTHIAZIDE 10-12.5 MG PO TABS
1.0000 | ORAL_TABLET | Freq: Every day | ORAL | 0 refills | Status: DC
Start: 2023-05-08 — End: 2023-07-15

## 2023-05-08 NOTE — Patient Instructions (Signed)
Healthy Heart:  Recommend heart healthy/Mediterranean diet, with whole grains, fruits, vegetable, fish, lean meats, nuts, and olive oil. Limit salt. Recommend moderate walking, 3-5 times/week for 30-50 minutes each session. Aim for at least 150 minutes.week. Goal should be pace of 3 miles/hours, or walking 1.5 miles in 30 minutes Recommend avoidance of tobacco products. Avoid excess alcohol.

## 2023-05-08 NOTE — Assessment & Plan Note (Addendum)
Taking lisinopril-hydrochlorothiazide 10-12.5 mg daily as prescribed. Forget to take this morning, took half hour before this visit. Blood pressure well controlled at home. Denies chest pain, shortness of breath, lower extremity edema, vision changes, and worsening headaches (treated for migraine). Improved upon recheck in office. No changes to regimen. Encouraged DASH diet, moderate weight loss, and moderate exercise. Refill sent. Follow-up in 3 months and continue monitoring blood pressure at home.

## 2023-05-08 NOTE — Progress Notes (Signed)
Established Patient Office Visit  Subjective   Patient ID: Courtney Kennedy, female    DOB: 03/01/1981  Age: 42 y.o. MRN: [id number]  Chief Complaint  Patient presents with   Medical Management of Chronic Issues    Patient is here for a 3 month follow up for HTN    HPI  Hypertension Medication compliance: taking lisinopril -hydrochlorothiazide 10-12. 5 mg daily as prescribed. Took only half hour before visit.  Denies chest pain, shortness of breath, lower extremity edema, vision changes, headaches. History of migraines, no unusual headaches.  Pertinent lab work: 8/5 CMP normal kidney function.  Monitoring at home: 130-135/80's  Tolerating medication well: no side effects  Continue current medication regimen: no changes, BP improved upon recheck.  Follow-up: 3 months   Review of Systems  Eyes:  Negative for blurred vision and double vision.  Respiratory:  Negative for shortness of breath.   Cardiovascular:  Negative for chest pain and leg swelling.  Neurological:  Positive for headaches (migraines).      Objective:     BP 138/84 (BP Location: Right Arm, Patient Position: Sitting, Cuff Size: Large)   Pulse 76   Temp 98.6 F (37 C) (Oral)   Resp 20   Ht 5\' 5"  (1.651 m)   Wt 278 lb 1.6 oz (126.1 kg)   SpO2 98%   BMI 46.28 kg/m  BP Readings from Last 3 Encounters:  05/08/23 138/84  03/19/23 135/82  02/22/23 (!) 140/86      Physical Exam Vitals and nursing note reviewed.  Constitutional:      General: She is not in acute distress.    Appearance: Normal appearance. She is obese.  Cardiovascular:     Rate and Rhythm: Normal rate and regular rhythm.     Heart sounds: Normal heart sounds.  Pulmonary:     Effort: Pulmonary effort is normal.     Breath sounds: Normal breath sounds.  Skin:    General: Skin is warm and dry.  Neurological:     General: No focal deficit present.     Mental Status: She is alert. Mental status is at baseline.  Psychiatric:         Mood and Affect: Mood normal.        Behavior: Behavior normal.        Thought Content: Thought content normal.        Judgment: Judgment normal.     No results found for any visits on 05/08/23.    The 10-year ASCVD risk score (Arnett DK, et al., 2019) is: 1.9%    Assessment & Plan:   Essential hypertension Assessment & Plan: Taking lisinopril-hydrochlorothiazide 10-12.5 mg daily as prescribed. Forget to take this morning, took half hour before this visit. Blood pressure well controlled at home. Denies chest pain, shortness of breath, lower extremity edema, vision changes, and worsening headaches (treated for migraine). Improved upon recheck in office. No changes to regimen. Encouraged DASH diet, moderate weight loss, and moderate exercise. Refill sent. Follow-up in 3 months and continue monitoring blood pressure at home.   Orders: -     Lisinopril-hydroCHLOROthiazide; Take 1 tablet by mouth daily.  Dispense: 90 tablet; Refill: 0  Intractable migraine with aura without status migrainosus -     Rizatriptan Benzoate; Take 1 tablet (10 mg total) by mouth as needed for migraine. May repeat dose in two hours if headache not resolved.  Dispense: 10 tablet; Refill: 0    Agrees with plan of care discussed.  Questions answered.   Return in about 3 months (around 08/07/2023) for HTN.    Novella Olive, FNP

## 2023-05-10 ENCOUNTER — Ambulatory Visit: Payer: 59 | Admitting: Family Medicine

## 2023-05-24 ENCOUNTER — Ambulatory Visit: Payer: 59 | Admitting: Family Medicine

## 2023-07-14 ENCOUNTER — Other Ambulatory Visit: Payer: Self-pay | Admitting: Family Medicine

## 2023-07-14 DIAGNOSIS — I10 Essential (primary) hypertension: Secondary | ICD-10-CM

## 2023-08-06 ENCOUNTER — Ambulatory Visit: Payer: 59 | Admitting: Family Medicine

## 2024-01-24 ENCOUNTER — Encounter: Payer: Self-pay | Admitting: Family Medicine

## 2024-01-24 ENCOUNTER — Ambulatory Visit: Admitting: Family Medicine

## 2024-01-24 VITALS — BP 150/100 | HR 74 | Temp 99.5°F | Resp 18 | Ht 65.0 in | Wt 281.4 lb

## 2024-01-24 DIAGNOSIS — I10 Essential (primary) hypertension: Secondary | ICD-10-CM

## 2024-01-24 MED ORDER — LISINOPRIL-HYDROCHLOROTHIAZIDE 20-12.5 MG PO TABS: 1.0000 | ORAL_TABLET | Freq: Every day | ORAL | 0 refills | Status: DC

## 2024-01-24 NOTE — Patient Instructions (Signed)
 Taking your blood pressure the proper way is important to ensure an accurate reading. Purchase a cuff that fits your arm size appropriately. Sit with your back against the chair and feet on the floor without your legs crossed.  Sit quietly for at least 5 minutes, (10 minutes is best)  before you start to measure your blood pressure. Prop your arm so that the arm is resting at your heart level. Record the reading to bring to your next follow-up visit.

## 2024-01-24 NOTE — Assessment & Plan Note (Signed)
 Taking lisinopril -hydrochlorothiazide  10-12.5 mg daily as prescribed.  Denies chest pain, shortness of breath, lower extremity edema, vision changes, headaches.  Pertinent lab work: needs BMP today  Blood pressure not controlled. Increase lisinopril  to 20 mg keep hydrochlorothiazide  12.5 mg daily. BMP today. Follow-up in 3 weeks with blood pressure log.

## 2024-01-24 NOTE — Progress Notes (Signed)
   Established Patient Office Visit  Subjective   Patient ID: MERIDA ALCANTAR, female    DOB: 03/13/1981  Age: 43 y.o. MRN: [id number]  Chief Complaint  Patient presents with   Medical Management of Chronic Issues    HTN    HPI  Hypertension Medication compliance: Taking lisinopril -hydrochlorothiazide  10-12.5 mg daily as prescribed.  Denies chest pain, shortness of breath, lower extremity edema, vision changes, headaches.  Pertinent lab work: needs BMP today  Monitoring at home: will start to monitor at home Tolerating medication well: no side effects Continue current medication regimen: increase lisinopril  to 20 mg keep hydrochlorothiazide  12.5 mg daily. Follow-up: 3 weeks    Review of Systems  Eyes:  Negative for blurred vision and double vision.  Respiratory:  Negative for shortness of breath.   Cardiovascular:  Negative for chest pain.  Neurological:  Negative for headaches.      Objective:     BP (!) 150/100 (BP Location: Right Arm, Patient Position: Sitting, Cuff Size: Large)   Pulse 74   Temp 99.5 F (37.5 C) (Oral)   Resp 18   Ht 5' 5 (1.651 m)   Wt 281 lb 6 oz (127.6 kg)   SpO2 100%   BMI 46.82 kg/m    Physical Exam Vitals and nursing note reviewed.  Constitutional:      Appearance: Normal appearance. She is obese.   Cardiovascular:     Rate and Rhythm: Regular rhythm.     Heart sounds: Normal heart sounds.  Pulmonary:     Effort: Pulmonary effort is normal.     Breath sounds: Normal breath sounds.   Skin:    General: Skin is warm and dry.   Neurological:     General: No focal deficit present.     Mental Status: She is alert. Mental status is at baseline.   Psychiatric:        Mood and Affect: Mood normal.        Behavior: Behavior normal.        Thought Content: Thought content normal.        Judgment: Judgment normal.     No results found for any visits on 01/24/24.    The 10-year ASCVD risk score (Arnett DK, et al., 2019)  is: 2.4%    Assessment & Plan:   Problem List Items Addressed This Visit     Elevated blood pressure reading in office with diagnosis of hypertension - Primary   Taking lisinopril -hydrochlorothiazide  10-12.5 mg daily as prescribed.  Denies chest pain, shortness of breath, lower extremity edema, vision changes, headaches.  Pertinent lab work: needs BMP today  Blood pressure not controlled. Increase lisinopril  to 20 mg keep hydrochlorothiazide  12.5 mg daily. BMP today. Follow-up in 3 weeks with blood pressure log.       Relevant Medications   lisinopril -hydrochlorothiazide  (ZESTORETIC ) 20-12.5 MG tablet   Other Relevant Orders   Comprehensive metabolic panel with GFR  Agrees with plan of care discussed.  Questions answered.   Return in about 3 weeks (around 02/14/2024) for HTN increased meds .    Mickiel Albany, FNP

## 2024-01-25 ENCOUNTER — Ambulatory Visit: Payer: Self-pay | Admitting: Family Medicine

## 2024-01-25 LAB — COMPREHENSIVE METABOLIC PANEL WITH GFR
ALT: 18 IU/L (ref 0–32)
AST: 14 IU/L (ref 0–40)
Albumin: 4.4 g/dL (ref 3.9–4.9)
Alkaline Phosphatase: 46 IU/L (ref 44–121)
BUN/Creatinine Ratio: 17 (ref 9–23)
BUN: 17 mg/dL (ref 6–24)
Bilirubin Total: 0.6 mg/dL (ref 0.0–1.2)
CO2: 21 mmol/L (ref 20–29)
Calcium: 9.6 mg/dL (ref 8.7–10.2)
Chloride: 99 mmol/L (ref 96–106)
Creatinine, Ser: 1 mg/dL (ref 0.57–1.00)
Globulin, Total: 2.5 g/dL (ref 1.5–4.5)
Glucose: 119 mg/dL — ABNORMAL HIGH (ref 70–99)
Potassium: 3.5 mmol/L (ref 3.5–5.2)
Sodium: 139 mmol/L (ref 134–144)
Total Protein: 6.9 g/dL (ref 6.0–8.5)
eGFR: 72 mL/min/{1.73_m2} (ref >59)

## 2024-02-14 ENCOUNTER — Ambulatory Visit: Admitting: Family Medicine

## 2024-02-14 ENCOUNTER — Encounter: Payer: Self-pay | Admitting: Family Medicine

## 2024-02-14 VITALS — BP 112/72 | HR 63 | Ht 65.0 in | Wt 275.0 lb

## 2024-02-14 DIAGNOSIS — M25512 Pain in left shoulder: Secondary | ICD-10-CM

## 2024-02-14 DIAGNOSIS — Z6841 Body Mass Index (BMI) 40.0 and over, adult: Secondary | ICD-10-CM

## 2024-02-14 DIAGNOSIS — E66813 Obesity, class 3: Secondary | ICD-10-CM

## 2024-02-14 DIAGNOSIS — I10 Essential (primary) hypertension: Secondary | ICD-10-CM

## 2024-02-14 DIAGNOSIS — E669 Obesity, unspecified: Secondary | ICD-10-CM | POA: Insufficient documentation

## 2024-02-14 MED ORDER — SEMAGLUTIDE-WEIGHT MANAGEMENT 0.25 MG/0.5ML ~~LOC~~ SOAJ: 0.2500 mg | SUBCUTANEOUS | 0 refills | Status: AC

## 2024-02-14 MED ORDER — LISINOPRIL-HYDROCHLOROTHIAZIDE 20-12.5 MG PO TABS: 1.0000 | ORAL_TABLET | Freq: Every day | ORAL | 1 refills | Status: DC

## 2024-02-14 NOTE — Assessment & Plan Note (Addendum)
 Getting semaglutide 0.25 mg from online compounding pharmacy. Has lost 6 pounds so far.  Would like to see if her insurance company will pay for it. Co-morbidities include hypertension.  BMI 45.7 Semaglutide 0.25 mg weekly. Follow-up in 4 weeks once gets approved.

## 2024-02-14 NOTE — Assessment & Plan Note (Signed)
 Taking lisinopril -hydrochlorothiazide  20-12.5 mg daily.  Denies chest pain, shortness of breath, lower extremity edema, vision changes, headaches.  Pertinent lab work: BMP today Well controlled in the office. Recommend random blood pressure checks at home. Refill sent. Follow-up in 6 months

## 2024-02-14 NOTE — Progress Notes (Signed)
 Established Patient Office Visit  Subjective   Patient ID: Courtney Kennedy, female    DOB: 04/21/1981  Age: 43 y.o. MRN: [id number]  Chief Complaint  Patient presents with   Medical Management of Chronic Issues    3 week fup on htn, wgt management      HPI  Hypertension Medication compliance: Taking lisinopril -hydrochlorothiazide  20-12.5 mg daily.  Denies chest pain, shortness of breath, lower extremity edema, vision changes, headaches.  Pertinent lab work: BMP today Monitoring at home: has not been monitoring at home.  Tolerating medication well: no side effects reported Continue current medication regimen: no changes Follow-up: 6 months  Obesity: BMI 45.6 Started compounded semaglutide through on line provider. Has taken 3 doses of 0.25 mg Interested to see if her insurance will cover this. Weight loss: 6 pounds   Left shoulder pain: Intermittent symptoms affecting ROM Symptoms present for 2 weeks. No bony tenderness. X-ray to rule out fracture. Will likely need referral to ortho    ROS    Objective:     BP 112/72 (BP Location: Right Arm, Patient Position: Sitting, Cuff Size: Large) Comment: machine was unable to read b/p twice  Pulse 63   Ht 5' 5 (1.651 m)   Wt 275 lb (124.7 kg)   SpO2 99%   BMI 45.76 kg/m    Physical Exam Vitals and nursing note reviewed.  Constitutional:      Appearance: Normal appearance. She is obese.  Cardiovascular:     Rate and Rhythm: Normal rate.  Pulmonary:     Effort: Pulmonary effort is normal.  Musculoskeletal:     Left shoulder: No swelling, deformity, tenderness or bony tenderness. Decreased range of motion.  Skin:    General: Skin is warm and dry.  Neurological:     General: No focal deficit present.     Mental Status: She is alert. Mental status is at baseline.  Psychiatric:        Mood and Affect: Mood normal.        Behavior: Behavior normal.        Thought Content: Thought content normal.         Judgment: Judgment normal.      No results found for any visits on 02/14/24.    The 10-year ASCVD risk score (Arnett DK, et al., 2019) is: 1.3%    Assessment & Plan:   Problem List Items Addressed This Visit     Essential hypertension - Primary   Taking lisinopril -hydrochlorothiazide  20-12.5 mg daily.  Denies chest pain, shortness of breath, lower extremity edema, vision changes, headaches.  Pertinent lab work: BMP today Well controlled in the office. Recommend random blood pressure checks at home. Refill sent. Follow-up in 6 months       Relevant Medications   lisinopril -hydrochlorothiazide  (ZESTORETIC ) 20-12.5 MG tablet   Other Relevant Orders   Basic metabolic panel with GFR   Obesity, unspecified   Getting semaglutide 0.25 mg from online compounding pharmacy. Has lost 6 pounds so far.  Would like to see if her insurance company will pay for it. Co-morbidities include hypertension.  BMI 45.7 Semaglutide 0.25 mg weekly. Follow-up in 4 weeks once gets approved.        Relevant Medications   Semaglutide-Weight Management 0.25 MG/0.5ML SOAJ   Acute pain of left shoulder   ROM decreased. No bony tenderness. Xray today before referral to ortho for evaluation.       Relevant Orders   DG Shoulder Left  Agrees with plan  of care discussed.  Questions answered.   Return in about 6 months (around 08/16/2024) for HTN.    Darice JONELLE Brownie, FNP

## 2024-02-14 NOTE — Assessment & Plan Note (Signed)
 ROM decreased. No bony tenderness. Xray today before referral to ortho for evaluation.

## 2024-02-14 NOTE — Patient Instructions (Signed)
 [facility]  [address]  The radiology department is on the first floor which is best accessed by going around to the back of the building. No appointment necessary. You can go at your convenience.

## 2024-02-15 ENCOUNTER — Other Ambulatory Visit: Payer: Self-pay | Admitting: Family Medicine

## 2024-02-15 ENCOUNTER — Ambulatory Visit: Payer: Self-pay | Admitting: Family Medicine

## 2024-02-15 DIAGNOSIS — R7989 Other specified abnormal findings of blood chemistry: Secondary | ICD-10-CM | POA: Insufficient documentation

## 2024-02-15 LAB — BASIC METABOLIC PANEL WITH GFR
BUN/Creatinine Ratio: 15 (ref 9–23)
BUN: 17 mg/dL (ref 6–24)
CO2: 19 mmol/L — ABNORMAL LOW (ref 20–29)
Calcium: 10 mg/dL (ref 8.7–10.2)
Chloride: 99 mmol/L (ref 96–106)
Creatinine, Ser: 1.12 mg/dL — ABNORMAL HIGH (ref 0.57–1.00)
Glucose: 85 mg/dL (ref 70–99)
Potassium: 4.1 mmol/L (ref 3.5–5.2)
Sodium: 136 mmol/L (ref 134–144)
eGFR: 63 mL/min/1.73 (ref >59)

## 2024-02-18 ENCOUNTER — Telehealth: Payer: Self-pay

## 2024-02-18 NOTE — Telephone Encounter (Signed)
 Prior auth for: SEMAGLUTIDE  0.25 MG Determination: PENDING Auth #: [id number] Valid from: N/A Patient notified via MyChart

## 2024-02-27 NOTE — Telephone Encounter (Signed)
 Prior auth for: SEMAGLUTIDE  0.25 MG Determination: DENIED Auth #: [id number] Reason: This drug or product cannot be approved because it is on a list of drugs and products that are not covered. Plan exclusion. Patient notified via MyChart

## 2024-04-22 ENCOUNTER — Ambulatory Visit: Payer: Self-pay

## 2024-04-22 NOTE — Telephone Encounter (Signed)
 FYI Only or Action Required?: FYI only for provider.  Patient was last seen in primary care on 02/14/2024 by Booker Darice SAUNDERS, FNP.  Called Nurse Triage reporting Stool Color Change. Blood in stool. Red water in toilet. Abdominal pain, nausea. Loose stool.  Symptoms began today.  Interventions attempted: Nothing.  Symptoms are: gradually worsening.  Triage Disposition: Go to ED Now (Notify PCP)  Patient/caregiver understands and will follow disposition?: Yes                  Copied from CRM [id number]. Topic: Clinical - Red Word Triage >> Apr 22, 2024  8:20 AM Laurier BROCKS wrote: Red Word that prompted transfer to Nurse Triage: Patient states she has been feeling unwell. She woke up due to stomach pain and nauseous. Patient would rate the pain level at 7, she has also notice blood in her stool. Reason for Disposition  SEVERE rectal bleeding (e.g., large blood clots; constant or on and off bleeding)  Answer Assessment - Initial Assessment Questions 1. APPEARANCE of BLOOD: What color is it? Is it passed separately, on the surface of the stool, or mixed in with the stool?      Mixed in 2. AMOUNT: How much blood was passed?      Fair amount 3. FREQUENCY: How many times has blood been passed with the stools?      Each time since last night 4. ONSET: When was the blood first seen in the stools? (Days or weeks)      today 5. DIARRHEA: Is there also some diarrhea? If Yes, ask: How many diarrhea stools in the past 24 hours?      diarrhea 6. CONSTIPATION: Do you have constipation? If Yes, ask: How bad is it?     no 7. RECURRENT SYMPTOMS: Have you had blood in your stools before? If Yes, ask: When was the last time? and What happened that time?      no 8. BLOOD THINNERS: Do you take any blood thinners? (e.g., aspirin, clopidogrel / Plavix, coumadin, heparin). Notes: Other strong blood thinners include: Arixtra (fondaparinux), Eliquis (apixaban), Pradaxa  (dabigatran), and Xarelto (rivaroxaban).     no 9. OTHER SYMPTOMS: Do you have any other symptoms?  (e.g., abdomen pain, vomiting, dizziness, fever)     Abdominal pain, nausea  Answer Assessment - Initial Assessment Questions 1. COLOR: What color is your stool? Is that color in part or all of the stool? (e.g., black, clay-colored, green, red)      blood 2. ONSET: When did you first notice this color change?     This morning 3. CAUSE: Have you eaten any food or taken any medicine of this color? Note: See listing in Background Information section.      no 4. OTHER SYMPTOMS: Do you have any other symptoms? (e.g., abdomen pain, diarrhea, fever, yellow eyes or skin).     Abdominal pain, nausea  Protocols used: Stools - Unusual Color-A-AH, Rectal Bleeding-A-AH

## 2024-04-30 ENCOUNTER — Ambulatory Visit: Admitting: Family Medicine

## 2024-07-23 LAB — LAB REPORT - SCANNED: EGFR: 94

## 2024-08-18 ENCOUNTER — Ambulatory Visit: Admitting: Family Medicine

## 2024-08-24 ENCOUNTER — Other Ambulatory Visit: Payer: Self-pay | Admitting: Family Medicine

## 2024-08-24 DIAGNOSIS — I10 Essential (primary) hypertension: Secondary | ICD-10-CM
